# Patient Record
Sex: Male | Born: 1970
Health system: Southern US, Community
[De-identification: ages and names within clinical notes are randomized; demographics above are authoritative.]

## PROBLEM LIST (undated history)

## (undated) DIAGNOSIS — E785 Hyperlipidemia, unspecified: Secondary | ICD-10-CM

## (undated) DIAGNOSIS — I10 Essential (primary) hypertension: Secondary | ICD-10-CM

## (undated) DIAGNOSIS — E119 Type 2 diabetes mellitus without complications: Secondary | ICD-10-CM

## (undated) DIAGNOSIS — M109 Gout, unspecified: Secondary | ICD-10-CM

## (undated) HISTORY — DX: Essential (primary) hypertension: I10

## (undated) HISTORY — DX: Gout, unspecified: M10.9

## (undated) HISTORY — PX: TONSILLECTOMY AND ADENOIDECTOMY: SHX28

## (undated) HISTORY — DX: Type 2 diabetes mellitus without complications: E11.9

## (undated) HISTORY — DX: Hyperlipidemia, unspecified: E78.5

---

## 2008-09-04 HISTORY — PX: KNEE ARTHROCENTESIS: SUR44

## 2011-01-16 ENCOUNTER — Other Ambulatory Visit: Payer: Self-pay | Admitting: Infectious Diseases

## 2011-01-16 ENCOUNTER — Ambulatory Visit
Admission: RE | Admit: 2011-01-16 | Discharge: 2011-01-16 | Disposition: A | Discharge status: Home / Self Care | Payer: No Typology Code available for payment source | Source: Ambulatory Visit | Attending: Infectious Diseases | Admitting: Infectious Diseases

## 2011-01-16 DIAGNOSIS — R7611 Nonspecific reaction to tuberculin skin test without active tuberculosis: Secondary | ICD-10-CM

## 2014-02-13 ENCOUNTER — Encounter (INDEPENDENT_AMBULATORY_CARE_PROVIDER_SITE_OTHER): Payer: Self-pay | Admitting: Surgery

## 2014-02-13 ENCOUNTER — Ambulatory Visit (INDEPENDENT_AMBULATORY_CARE_PROVIDER_SITE_OTHER): Payer: BC Managed Care – PPO | Admitting: General Surgery

## 2014-03-02 ENCOUNTER — Ambulatory Visit (INDEPENDENT_AMBULATORY_CARE_PROVIDER_SITE_OTHER): Payer: BC Managed Care – PPO | Admitting: Surgery

## 2014-03-02 ENCOUNTER — Encounter (INDEPENDENT_AMBULATORY_CARE_PROVIDER_SITE_OTHER): Payer: Self-pay | Admitting: Surgery

## 2014-03-02 VITALS — BP 124/80 | HR 80 | Temp 97.1°F | Ht 74.0 in | Wt 285.0 lb

## 2014-03-02 DIAGNOSIS — L0501 Pilonidal cyst with abscess: Secondary | ICD-10-CM

## 2014-03-02 NOTE — Progress Notes (Signed)
Subjective:     Patient ID: Micheal Delgado, male   DOB: 1971/03/24, 43 y.o.   MRN: [id number]  HPI This is a pleasant gentleman referred by Dr. Ihor DowNnodi for evaluation for chronic pilonidal abscess. His first occurrence was only 2 months ago. He had 2 separate courses of antibiotics and the area has totally resolved now. He is currently pain-free he has had no further drainage.  Review of Systems     Objective:   Physical Exam On examination, at the top of his gluteal cleft there is a chronic ingrown hair area consistent with a chronic pilonidal cyst. There is no erythema in the area is nontender. He is area of the gluteal cleft    Assessment:     Chronic pilonidal cyst with abscess     Plan:     I discussed the diagnosis with him in detail. I discussed surgical removal of this area. I also discussed continued expectant management. He would like to follow this expectantly which I feel is reasonable. He'll come back and see me if he develops another infection. If not I will see him as needed

## 2014-03-03 ENCOUNTER — Ambulatory Visit (INDEPENDENT_AMBULATORY_CARE_PROVIDER_SITE_OTHER): Payer: BC Managed Care – PPO | Admitting: General Surgery

## 2014-12-16 ENCOUNTER — Encounter: Payer: Self-pay | Admitting: Skilled Nursing Facility1

## 2014-12-16 ENCOUNTER — Encounter: Payer: BLUE CROSS/BLUE SHIELD | Attending: Family Medicine | Admitting: Skilled Nursing Facility1

## 2014-12-16 VITALS — Ht 74.0 in | Wt 288.0 lb

## 2014-12-16 DIAGNOSIS — E119 Type 2 diabetes mellitus without complications: Secondary | ICD-10-CM | POA: Diagnosis not present

## 2014-12-16 DIAGNOSIS — Z713 Dietary counseling and surveillance: Secondary | ICD-10-CM | POA: Diagnosis not present

## 2014-12-16 NOTE — Progress Notes (Signed)
Diabetes Self-Management Education  Visit Type:    Appt. Start Time: 4:30 Appt. End Time: 5:45  12/16/2014  Mr. Micheal Delgado, identified by name and date of birth, is a 44 y.o. male with a diagnosis of Diabetes: Type 2.  Other people present during visit:      ASSESSMENT  Height  (1.88 m), weight 288 lb (130.636 kg). Body mass index is 36.96 kg/(m^2).  Pt states he has stopped eating red meat in order to better manage his gout which has worked for him. Pt states he has ankle and knee pain which keeps him from working out regularly. Pt states he has lost about 30 pounds within the last year. Pt states he has gout flares with beans he avoids them.  Initial Visit Information:  Are you currently following a meal plan?: No   Are you taking your medications as prescribed?: Yes Are you checking your feet?: Yes How many days per week are you checking your feet?: 7 How often do you need to have someone help you when you read instructions, pamphlets, or other written materials from your doctor or pharmacy?: 1 - Never    Psychosocial:     Patient Belief/Attitude about Diabetes: Motivated to manage diabetes Self-care barriers: None Self-management support: Family Patient Concerns: Nutrition/Meal planning, Glycemic Control, Weight Control Special Needs: None Preferred Learning Style: Auditory Learning Readiness: Ready  Complications:   Last HgB A1C per patient/outside source: 7.8 mg/dL How often do you check your blood sugar?: 0 times/day (not testing) Number of hypoglycemic episodes per month: 0 Number of hyperglycemic episodes per week: 0 Have you had a dilated eye exam in the past 12 months?: Yes Have you had a dental exam in the past 12 months?: Yes  Diet Intake:  Breakfast: peanut butter and jelly sandwhich----dry cereal----oatmeal Snack (morning): apple Lunch: cucumber with hummus, yogurt, fruit Snack (afternoon): peanuts Dinner: vegetable, starch, protein Snack  (evening): none Beverage(s): carbonated water, skim milk, tea/coffee, wine on saturday nights  Exercise:  Exercise:  (4 days a week, it varies, 30-45 minutes)  Individualized Plan for Diabetes Self-Management Training:   Learning Objective:  Patient will have a greater understanding of diabetes self-management.  Patient education plan per assessed needs and concerns is to attend individual sessions for     Education Topics Reviewed with Patient Today:  Definition of diabetes, type 1 and 2, and the diagnosis of diabetes Role of diet in the treatment of diabetes and the relationship between the three main macronutrients and blood glucose level, Food label reading, portion sizes and measuring food., Carbohydrate counting, Reviewed blood glucose goals for pre and post meals and how to evaluate the patients' food intake on their blood glucose level., Information on hints to eating out and maintain blood glucose control. Role of exercise on diabetes management, blood pressure control and cardiac health.                PATIENTS GOALS/Plan (Developed by the patient):  Nutrition: Follow meal plan discussed, General guidelines for healthy choices and portions discussed, Adjust meds/carbs with exercise as discussed Physical Activity: Exercise 5-7 days per week Monitoring : test my blood glucose as discussed (note x per day with comment) (A couple times a week)  Plan:   There are no Patient Instructions on file for this visit.  Expected Outcomes:  Demonstrated interest in learning. Expect positive outcomes  Education material provided: Living Well with Diabetes, Meal plan card and Snack sheet  If problems or questions, patient  to contact team via:  Phone  Future DSME appointment: PRN

## 2016-11-05 DIAGNOSIS — R05 Cough: Secondary | ICD-10-CM | POA: Diagnosis not present

## 2016-11-20 DIAGNOSIS — I10 Essential (primary) hypertension: Secondary | ICD-10-CM | POA: Diagnosis not present

## 2016-11-20 DIAGNOSIS — E119 Type 2 diabetes mellitus without complications: Secondary | ICD-10-CM | POA: Diagnosis not present

## 2016-11-20 DIAGNOSIS — E7800 Pure hypercholesterolemia, unspecified: Secondary | ICD-10-CM | POA: Diagnosis not present

## 2016-11-22 DIAGNOSIS — E7800 Pure hypercholesterolemia, unspecified: Secondary | ICD-10-CM | POA: Diagnosis not present

## 2016-11-22 DIAGNOSIS — E119 Type 2 diabetes mellitus without complications: Secondary | ICD-10-CM | POA: Diagnosis not present

## 2016-11-22 DIAGNOSIS — I10 Essential (primary) hypertension: Secondary | ICD-10-CM | POA: Diagnosis not present

## 2016-11-22 DIAGNOSIS — R809 Proteinuria, unspecified: Secondary | ICD-10-CM | POA: Diagnosis not present

## 2017-02-15 DIAGNOSIS — M65872 Other synovitis and tenosynovitis, left ankle and foot: Secondary | ICD-10-CM | POA: Diagnosis not present

## 2017-02-15 DIAGNOSIS — M25571 Pain in right ankle and joints of right foot: Secondary | ICD-10-CM | POA: Diagnosis not present

## 2017-02-15 DIAGNOSIS — M25572 Pain in left ankle and joints of left foot: Secondary | ICD-10-CM | POA: Diagnosis not present

## 2017-02-15 DIAGNOSIS — M722 Plantar fascial fibromatosis: Secondary | ICD-10-CM | POA: Diagnosis not present

## 2017-02-15 DIAGNOSIS — M79671 Pain in right foot: Secondary | ICD-10-CM | POA: Diagnosis not present

## 2017-02-15 DIAGNOSIS — M79672 Pain in left foot: Secondary | ICD-10-CM | POA: Diagnosis not present

## 2017-03-04 DIAGNOSIS — T63483A Toxic effect of venom of other arthropod, assault, initial encounter: Secondary | ICD-10-CM | POA: Diagnosis not present

## 2017-03-08 DIAGNOSIS — M722 Plantar fascial fibromatosis: Secondary | ICD-10-CM | POA: Diagnosis not present

## 2017-03-14 DIAGNOSIS — E119 Type 2 diabetes mellitus without complications: Secondary | ICD-10-CM | POA: Diagnosis not present

## 2017-03-14 DIAGNOSIS — I10 Essential (primary) hypertension: Secondary | ICD-10-CM | POA: Diagnosis not present

## 2017-05-23 DIAGNOSIS — M71571 Other bursitis, not elsewhere classified, right ankle and foot: Secondary | ICD-10-CM | POA: Diagnosis not present

## 2017-05-23 DIAGNOSIS — M65872 Other synovitis and tenosynovitis, left ankle and foot: Secondary | ICD-10-CM | POA: Diagnosis not present

## 2017-05-23 DIAGNOSIS — M722 Plantar fascial fibromatosis: Secondary | ICD-10-CM | POA: Diagnosis not present

## 2017-07-16 DIAGNOSIS — M722 Plantar fascial fibromatosis: Secondary | ICD-10-CM | POA: Diagnosis not present

## 2017-07-16 DIAGNOSIS — M71572 Other bursitis, not elsewhere classified, left ankle and foot: Secondary | ICD-10-CM | POA: Diagnosis not present

## 2017-07-16 DIAGNOSIS — Z7984 Long term (current) use of oral hypoglycemic drugs: Secondary | ICD-10-CM | POA: Diagnosis not present

## 2017-07-16 DIAGNOSIS — H35033 Hypertensive retinopathy, bilateral: Secondary | ICD-10-CM | POA: Diagnosis not present

## 2017-07-16 DIAGNOSIS — E119 Type 2 diabetes mellitus without complications: Secondary | ICD-10-CM | POA: Diagnosis not present

## 2017-07-16 DIAGNOSIS — M71571 Other bursitis, not elsewhere classified, right ankle and foot: Secondary | ICD-10-CM | POA: Diagnosis not present

## 2017-10-02 DIAGNOSIS — J029 Acute pharyngitis, unspecified: Secondary | ICD-10-CM | POA: Diagnosis not present

## 2017-10-02 DIAGNOSIS — J111 Influenza due to unidentified influenza virus with other respiratory manifestations: Secondary | ICD-10-CM | POA: Diagnosis not present

## 2017-11-12 DIAGNOSIS — E119 Type 2 diabetes mellitus without complications: Secondary | ICD-10-CM | POA: Diagnosis not present

## 2017-11-12 DIAGNOSIS — E7800 Pure hypercholesterolemia, unspecified: Secondary | ICD-10-CM | POA: Diagnosis not present

## 2017-11-15 DIAGNOSIS — I10 Essential (primary) hypertension: Secondary | ICD-10-CM | POA: Diagnosis not present

## 2017-11-15 DIAGNOSIS — Z0000 Encounter for general adult medical examination without abnormal findings: Secondary | ICD-10-CM | POA: Diagnosis not present

## 2017-11-15 DIAGNOSIS — E119 Type 2 diabetes mellitus without complications: Secondary | ICD-10-CM | POA: Diagnosis not present

## 2017-11-15 DIAGNOSIS — E7800 Pure hypercholesterolemia, unspecified: Secondary | ICD-10-CM | POA: Diagnosis not present

## 2018-04-15 DIAGNOSIS — Z6835 Body mass index (BMI) 35.0-35.9, adult: Secondary | ICD-10-CM | POA: Diagnosis not present

## 2018-04-15 DIAGNOSIS — L247 Irritant contact dermatitis due to plants, except food: Secondary | ICD-10-CM | POA: Diagnosis not present

## 2018-05-18 DIAGNOSIS — J029 Acute pharyngitis, unspecified: Secondary | ICD-10-CM | POA: Diagnosis not present

## 2018-06-12 DIAGNOSIS — E119 Type 2 diabetes mellitus without complications: Secondary | ICD-10-CM | POA: Diagnosis not present

## 2018-06-12 DIAGNOSIS — I10 Essential (primary) hypertension: Secondary | ICD-10-CM | POA: Diagnosis not present

## 2018-06-12 DIAGNOSIS — Z23 Encounter for immunization: Secondary | ICD-10-CM | POA: Diagnosis not present

## 2018-06-12 DIAGNOSIS — E7800 Pure hypercholesterolemia, unspecified: Secondary | ICD-10-CM | POA: Diagnosis not present

## 2018-10-18 ENCOUNTER — Emergency Department (HOSPITAL_COMMUNITY): Payer: BLUE CROSS/BLUE SHIELD

## 2018-10-18 ENCOUNTER — Emergency Department (HOSPITAL_COMMUNITY)
Admission: EM | Admit: 2018-10-18 | Discharge: 2018-10-19 | Disposition: A | Discharge status: Home / Self Care | Payer: BLUE CROSS/BLUE SHIELD | Attending: Emergency Medicine | Admitting: Emergency Medicine

## 2018-10-18 ENCOUNTER — Other Ambulatory Visit: Payer: Self-pay

## 2018-10-18 ENCOUNTER — Encounter (HOSPITAL_COMMUNITY): Payer: Self-pay

## 2018-10-18 DIAGNOSIS — Z7984 Long term (current) use of oral hypoglycemic drugs: Secondary | ICD-10-CM | POA: Diagnosis not present

## 2018-10-18 DIAGNOSIS — E119 Type 2 diabetes mellitus without complications: Secondary | ICD-10-CM | POA: Insufficient documentation

## 2018-10-18 DIAGNOSIS — Z79899 Other long term (current) drug therapy: Secondary | ICD-10-CM | POA: Diagnosis not present

## 2018-10-18 DIAGNOSIS — R079 Chest pain, unspecified: Secondary | ICD-10-CM | POA: Diagnosis not present

## 2018-10-18 DIAGNOSIS — R0602 Shortness of breath: Secondary | ICD-10-CM | POA: Diagnosis not present

## 2018-10-18 DIAGNOSIS — E785 Hyperlipidemia, unspecified: Secondary | ICD-10-CM | POA: Insufficient documentation

## 2018-10-18 LAB — CBC
HCT: 46.5 % (ref 39.0–52.0)
Hemoglobin: 15.3 g/dL (ref 13.0–17.0)
MCH: 29.1 pg (ref 26.0–34.0)
MCHC: 32.9 g/dL (ref 30.0–36.0)
MCV: 88.4 fL (ref 80.0–100.0)
PLATELETS: 306 10*3/uL (ref 150–400)
RBC: 5.26 MIL/uL (ref 4.22–5.81)
RDW: 12.9 % (ref 11.5–15.5)
WBC: 10.4 10*3/uL (ref 4.0–10.5)
nRBC: 0 % (ref 0.0–0.2)

## 2018-10-18 LAB — BASIC METABOLIC PANEL
Anion gap: 11 (ref 5–15)
BUN: 18 mg/dL (ref 6–20)
CALCIUM: 9 mg/dL (ref 8.9–10.3)
CO2: 24 mmol/L (ref 22–32)
Chloride: 106 mmol/L (ref 98–111)
Creatinine, Ser: 1.13 mg/dL (ref 0.61–1.24)
GFR calc non Af Amer: >60 mL/min (ref >60)
Glucose, Bld: 180 mg/dL — ABNORMAL HIGH (ref 70–99)
Potassium: 3.9 mmol/L (ref 3.5–5.1)
Sodium: 141 mmol/L (ref 135–145)

## 2018-10-18 LAB — I-STAT TROPONIN, ED: Troponin i, poc: 0 ng/mL (ref 0.00–0.08)

## 2018-10-18 MED ORDER — SODIUM CHLORIDE 0.9% FLUSH: 3.0000 mL | Freq: Once | INTRAVENOUS | Status: DC

## 2018-10-18 MED ORDER — AMLODIPINE BESYLATE 5 MG PO TABS
5.0000 mg | ORAL_TABLET | Freq: Once | ORAL | Status: AC
  Administered 2018-10-19: 5 mg via ORAL
  Filled 2018-10-18: qty 1

## 2018-10-18 NOTE — ED Triage Notes (Signed)
Pt states that he has been having CP, tightness and SOB since last night with diaphoresis. Some nausea, denies radiation

## 2018-10-19 LAB — I-STAT TROPONIN, ED: Troponin i, poc: 0 ng/mL (ref 0.00–0.08)

## 2018-10-19 MED ORDER — PANTOPRAZOLE SODIUM 40 MG PO TBEC: 40.0000 mg | DELAYED_RELEASE_TABLET | Freq: Every day | ORAL | 0 refills | Status: AC

## 2018-10-19 NOTE — ED Notes (Signed)
Patient verbalizes understanding of discharge instructions. Opportunity for questioning and answers were provided. Armband removed by staff, pt discharged from ED.  

## 2018-10-19 NOTE — Discharge Instructions (Signed)
Take a low-dose aspirin daily.  Schedule follow-up with cardiology for recheck.  You may also want to schedule follow-up with your primary doctor for recheck of your blood pressure.  Return to the ER for any persistent chest pain.

## 2018-10-19 NOTE — ED Provider Notes (Signed)
[facility] EMERGENCY DEPARTMENT Provider Note   CSN: [id number] Arrival date & time: 10/18/18  1955     History   Chief Complaint Chief Complaint  Patient presents with  . Chest Pain    HPI Micheal Delgado is a 48 y.o. male.  Patient presents to the emergency department for evaluation of chest pain.  Patient has had chest pain each of the last 2 nights.  He reports that he had pain while lying down on the couch last night, woke up and the pain was still there, went to bed and then woke up feeling well this morning.  He did well through the day, then went out for sushi tonight.  After he ate tonight he started to have left-sided pain again.  He describes it as a tightness.  He reports that there was some shortness of breath and diaphoresis associated with it, however, thinks he might of had a small panic attack.  Symptoms are now resolved.  No previous cardiac history but does have a history of hypertension, borderline hypercholesterolemia, diabetes and obesity.  No family history of early heart disease.     Past Medical History:  Diagnosis Date  . Diabetes mellitus without complication (HCC)   . Gout   . Hyperlipidemia     Patient Active Problem List   Diagnosis Date Noted  . Pilonidal cyst with abscess 03/02/2014    Past Surgical History:  Procedure Laterality Date  . KNEE ARTHROCENTESIS  2010   right  . TONSILLECTOMY AND ADENOIDECTOMY          Home Medications    Prior to Admission medications   Medication Sig Start Date End Date Taking? Authorizing Provider  amLODipine (NORVASC) 2.5 MG tablet Take 2.5 mg by mouth daily. 09/29/18  Yes [provider]  fluticasone (FLONASE) 50 MCG/ACT nasal spray Place 1-2 sprays into both nostrils daily as needed (for seasonal allergies).    Yes [provider]  ibuprofen (ADVIL,MOTRIN) 200 MG tablet Take 400 mg by mouth every 8 (eight) hours as needed (for pain).   Yes [provider]    lisinopril (PRINIVIL,ZESTRIL) 40 MG tablet Take 40 mg by mouth daily.   Yes [provider]  metFORMIN (GLUCOPHAGE) 500 MG tablet Take 1,000 mg by mouth 2 (two) times daily with a meal.    Yes [provider]  pantoprazole (PROTONIX) 40 MG tablet Take 1 tablet (40 mg total) by mouth daily. 10/19/18   Gilda Crease, MD    Family History Family History  Problem Relation Age of Onset  . Heart disease Other     Social History Social History   Tobacco Use  . Smoking status: Never Smoker  Substance Use Topics  . Alcohol use: No  . Drug use: No     Allergies   Patient has no known allergies.   Review of Systems Review of Systems  Constitutional: Positive for diaphoresis.  Respiratory: Positive for chest tightness and shortness of breath.   Cardiovascular: Positive for chest pain.  All other systems reviewed and are negative.    Physical Exam Updated Vital Signs BP (!) 155/87   Pulse (!) 57   Temp 97.7 F (36.5 C) (Oral)   Resp (!) 22   Ht 6' 1.5" (1.867 m)   Wt 121.1 kg   SpO2 94%   BMI 34.75 kg/m   Physical Exam Vitals signs and nursing note reviewed.  Constitutional:      General: He is not in  acute distress.    Appearance: Normal appearance. He is well-developed.  HENT:     Head: Normocephalic and atraumatic.     Right Ear: Hearing normal.     Left Ear: Hearing normal.     Nose: Nose normal.  Eyes:     Conjunctiva/sclera: Conjunctivae normal.     Pupils: Pupils are equal, round, and reactive to light.  Neck:     Musculoskeletal: Normal range of motion and neck supple.  Cardiovascular:     Rate and Rhythm: Regular rhythm.     Heart sounds: S1 normal and S2 normal. No murmur. No friction rub. No gallop.   Pulmonary:     Effort: Pulmonary effort is normal. No respiratory distress.     Breath sounds: Normal breath sounds.  Chest:     Chest wall: No tenderness.  Abdominal:     General: Bowel sounds are normal.     Palpations:  Abdomen is soft.     Tenderness: There is no abdominal tenderness. There is no guarding or rebound. Negative signs include Murphy's sign and McBurney's sign.     Hernia: No hernia is present.  Musculoskeletal: Normal range of motion.  Skin:    General: Skin is warm and dry.     Findings: No rash.  Neurological:     Mental Status: He is alert and oriented to person, place, and time.     GCS: GCS eye subscore is 4. GCS verbal subscore is 5. GCS motor subscore is 6.     Cranial Nerves: No cranial nerve deficit.     Sensory: No sensory deficit.     Coordination: Coordination normal.  Psychiatric:        Speech: Speech normal.        Behavior: Behavior normal.        Thought Content: Thought content normal.      ED Treatments / Results  Labs (all labs ordered are listed, but only abnormal results are displayed) Labs Reviewed  BASIC METABOLIC PANEL - Abnormal; Notable for the following components:      Result Value   Glucose, Bld 180 (*)    All other components within normal limits  CBC  I-STAT TROPONIN, ED  I-STAT TROPONIN, ED    EKG EKG Interpretation  Date/Time:  Saturday October 19 2018 00:12:32 EST Ventricular Rate:  59 PR Interval:    QRS Duration: 105 QT Interval:  425 QTC Calculation: 421 R Axis:   75 Text Interpretation:  Sinus rhythm Abnormal R-wave progression, early transition Probable anterolateral infarct, old Nonspecific ST abnormality Baseline wander in lead(s) V1 No previous tracing Confirmed by Gilda CreasePollina, Guadalupe Kerekes J [phone]([id number]) on 10/19/2018 12:16:47 AM   Radiology Dg Chest 2 View  Result Date: 10/18/2018 CLINICAL DATA:  Chest pain, tightness and dyspnea since last evening. EXAM: CHEST - 2 VIEW COMPARISON:  01/16/2011 FINDINGS: The heart size and mediastinal contours are within normal limits. Both lungs are clear. The visualized skeletal structures are unremarkable. IMPRESSION: No active cardiopulmonary disease. Electronically Signed   By: Tollie Ethavid  Kwon M.D.    On: 10/18/2018 21:29    Procedures Procedures (including critical care time)  Medications Ordered in ED Medications  sodium chloride flush (NS) 0.9 % injection 3 mL (has no administration in time range)  amLODipine (NORVASC) tablet 5 mg (5 mg Oral Given 10/19/18 0001)     Initial Impression / Assessment and Plan / ED Course  I have reviewed the triage vital signs and the nursing notes.  Pertinent labs &  imaging results that were available during my care of the patient were reviewed by me and considered in my medical decision making (see chart for details).     Patient presents to the emergency department for evaluation of chest pain.  He has had episodes of chest pain twice in the last 24 hours.  At time of evaluation in the ER his symptoms have resolved.  Patient developed left-sided chest pain after eating tonight.  He thinks it might of been indigestion, which he has had in the past.  He does, however, have multiple cardiac risk factors.  His EKG with nonspecific changes, no ST depressions, no evidence of LVH or conduction delay.  Patient's heart score is a 3.  As he is pain-free currently and has been since arrival in the ER, he is felt to be low risk.  Troponin negative x2.  Blood pressure was elevated on arrival, improved with additional Norvasc.  Patient will initiate low-dose aspirin, follow-up with cardiology for possible outpatient stress test.  Return for recurrent chest pain that is persistent.  Follow-up with PCP for blood pressure check.  Final Clinical Impressions(s) / ED Diagnoses   Final diagnoses:  Chest pain, unspecified type    ED Discharge Orders         Ordered    pantoprazole (PROTONIX) 40 MG tablet  Daily     10/19/18 0231           Gilda Crease, MD 10/19/18 [phone]

## 2018-10-21 ENCOUNTER — Encounter: Payer: Self-pay | Admitting: Cardiology

## 2018-10-21 DIAGNOSIS — I10 Essential (primary) hypertension: Secondary | ICD-10-CM | POA: Insufficient documentation

## 2018-10-21 DIAGNOSIS — E785 Hyperlipidemia, unspecified: Secondary | ICD-10-CM | POA: Insufficient documentation

## 2018-10-21 DIAGNOSIS — R079 Chest pain, unspecified: Secondary | ICD-10-CM | POA: Insufficient documentation

## 2018-10-21 DIAGNOSIS — E119 Type 2 diabetes mellitus without complications: Secondary | ICD-10-CM | POA: Insufficient documentation

## 2018-10-21 NOTE — Progress Notes (Signed)
Cardiology Office Note    Date:  10/22/2018   ID:  Micheal Delgado, DOB 1971-03-21, MRN [id number]  PCP:  Sigmund Hazel, MD  Cardiologist:  Armanda Magic, MD   Chief Complaint  Patient presents with  . New Patient (Initial Visit)    Chest pain    History of Present Illness:  Micheal Delgado is a 48 y.o. male who is being seen today for the evaluation of chest pain at the request of Sigmund Hazel, MD.  This is a 48yo male with a history of DM, HTN and hyperlipidemia who was seen in the ER on 2/14 with complaints of CP.  He states since last Thursday he has been having off-and-on chest pain that mainly occurs at night when he is in bed.  He will sometimes notice it when he gets up in the morning but as the day progresses it resolves on its own.  It is not associated with exertion in any way.  He thought it was reflux because he is had a lot of belching and the belching does help relieve the pressure.  He is also had to sit up in a recliner for the past few nights because the discomfort in his chest is so uncomfortable.  He is currently on Protonix 40 mg daily for GERD.  He was initially laying on the couch when the pain started.  He states that the first night he had it he fell asleep on the couch and it was still there when he woke up and then went to bed and in the am the pain was gone.  The next night he ate Sushi and the pain reoccurred.  He described it as a tightness and left sided associated with SOB, belching and diaphoresis but thinks that it may be related to GERD.  In ER his workup was normal with troponin 0 x 2 and EKG showed early repolarization.  Cxray was normal. He has no hx of tobacco use and no immediate family hx of premature CAD both his grandfathers died in their 37s of MIs and he has a half brother who died at 33 of reported MI but also had an arrhythmia.Marland Kitchen He is now referred to Cardiology for further evaluation.     Past Medical History:  Diagnosis Date  . Diabetes mellitus  without complication (HCC)   . Gout   . Hyperlipidemia   . Hypertension     Past Surgical History:  Procedure Laterality Date  . KNEE ARTHROCENTESIS  2010   right  . TONSILLECTOMY AND ADENOIDECTOMY      Current Medications: Current Meds  Medication Sig  . amLODipine (NORVASC) 2.5 MG tablet Take 2.5 mg by mouth daily.  . fluticasone (FLONASE) 50 MCG/ACT nasal spray Place 1-2 sprays into both nostrils daily as needed (for seasonal allergies).   Marland Kitchen ibuprofen (ADVIL,MOTRIN) 200 MG tablet Take 400 mg by mouth every 8 (eight) hours as needed (for pain).  Marland Kitchen lisinopril (PRINIVIL,ZESTRIL) 40 MG tablet Take 40 mg by mouth daily.  . metFORMIN (GLUCOPHAGE) 500 MG tablet Take 1,000 mg by mouth 2 (two) times daily with a meal.   . pantoprazole (PROTONIX) 40 MG tablet Take 1 tablet (40 mg total) by mouth daily.    Allergies:   Patient has no known allergies.   Social History   Socioeconomic History  . Marital status: Married    Spouse name: Not on file  . Number of children: Not on file  . Years of education: Not on file  .  Highest education level: Not on file  Occupational History  . Not on file  Social Needs  . Financial resource strain: Not on file  . Food insecurity:    Worry: Not on file    Inability: Not on file  . Transportation needs:    Medical: Not on file    Non-medical: Not on file  Tobacco Use  . Smoking status: Never Smoker  . Smokeless tobacco: Never Used  Substance and Sexual Activity  . Alcohol use: No  . Drug use: No  . Sexual activity: Not on file  Lifestyle  . Physical activity:    Days per week: Not on file    Minutes per session: Not on file  . Stress: Not on file  Relationships  . Social connections:    Talks on phone: Not on file    Gets together: Not on file    Attends religious service: Not on file    Active member of club or organization: Not on file    Attends meetings of clubs or organizations: Not on file    Relationship status: Not on file   Other Topics Concern  . Not on file  Social History Narrative  . Not on file     Family History:  The patient's family history includes Heart disease in an other family member.   ROS:   Please see the history of present illness.    ROS All other systems reviewed and are negative.  No flowsheet data found.     PHYSICAL EXAM:   VS:  BP (!) 150/98   Pulse 72   Ht 6' 1.5" (1.867 m)   Wt 273 lb 12.8 oz (124.2 kg)   SpO2 93%   BMI 35.63 kg/m    GEN: Well nourished, well developed, in no acute distress  HEENT: normal  Neck: no JVD, carotid bruits, or masses Cardiac: RRR; no murmurs, rubs, or gallops,no edema.  Intact distal pulses bilaterally.  Respiratory:  clear to auscultation bilaterally, normal work of breathing GI: soft, nontender, nondistended, + BS MS: no deformity or atrophy  Skin: warm and dry, no rash Neuro:  Alert and Oriented x 3, Strength and sensation are intact Psych: euthymic mood, full affect  Wt Readings from Last 3 Encounters:  10/22/18 273 lb 12.8 oz (124.2 kg)  10/18/18 267 lb (121.1 kg)  12/16/14 288 lb (130.6 kg)      Studies/Labs Reviewed:   EKG:  EKG is not ordered today.  Recent Labs: 10/18/2018: BUN 18; Creatinine, Ser 1.13; Hemoglobin 15.3; Platelets 306; Potassium 3.9; Sodium 141   Lipid Panel No results found for: CHOL, TRIG, HDL, CHOLHDL, VLDL, LDLCALC, LDLDIRECT  Additional studies/ records that were reviewed today include:  Hospital notes and labs    ASSESSMENT:    1. Chest pain, unspecified type   2. Benign essential HTN   3. Hyperlipidemia LDL goal <70   4. Type 2 diabetes mellitus with hyperosmolarity without coma, without long-term current use of insulin (HCC)   5. Essential hypertension      PLAN:  In order of problems listed above:  1.  Chest pain - symptoms atypical in that they started at rest while laying on the couch and continued during the night.  Then reoccurred the next night after eating dinner.  His  symptoms sound more related to GERD and that they are much worse when he lays in bed and actually has had to sleep sitting in a chair and they improved with exertion.  They are also associated with a lot of belching.  EKG with early repolarization and Trop neg x 2.  He does have CRFs including HTN, hyperlipidemia and DM. His EKG is not normal and shows early repol.  I have recommend chest CT for coronary calcium scoring as well as a stress Myoview to rule out ischemia.    2.  HTN - BP is poorly controlled on exam.  He tells me that his blood pressure was well controlled on his physical exam back in the fall but when he was in the ER the BP was elevated and he is noticed over the past couple days the blood pressure is more elevated than usual.  He will continue on Amlodipine 2.5mg  daily and Lisinopril 40mg  daily.  I encouraged him to make an appointment with his PCP to discuss his blood pressure treatment.  3.  Hyperlipidemia - This is followed by PCP and currently diet controlled.   4. Type 2 Dm - followed by PCP.  Currently on Metformin.    Medication Adjustments/Labs and Tests Ordered: Current medicines are reviewed at length with the patient today.  Concerns regarding medicines are outlined above.  Medication changes, Labs and Tests ordered today are listed in the Patient Instructions below.  Patient Instructions  Medication Instructions:  Your provider recommends that you continue on your current medications as directed. Please refer to the Current Medication list given to you today.    Labwork: None  Testing/Procedures: Dr. Mayford Knife recommends you have a CORONARY CALCIUM SCORE.  Your physician has requested that you have en exercise stress myoview. For further information please visit [email or web address] Please follow instruction sheet, as given.  Follow-Up: Your provider recommends that you schedule a follow-up appointment AS NEEDED pending study results.    Signed, Armanda Magic,  MD  10/22/2018 3:37 PM    [facility] [address] [place], [place], [place]  [address] Phone: [phone]; Fax: [phone]

## 2018-10-22 ENCOUNTER — Ambulatory Visit: Payer: BLUE CROSS/BLUE SHIELD | Admitting: Cardiology

## 2018-10-22 ENCOUNTER — Encounter: Payer: Self-pay | Admitting: Cardiology

## 2018-10-22 DIAGNOSIS — E785 Hyperlipidemia, unspecified: Secondary | ICD-10-CM

## 2018-10-22 DIAGNOSIS — E1100 Type 2 diabetes mellitus with hyperosmolarity without nonketotic hyperglycemic-hyperosmolar coma (NKHHC): Secondary | ICD-10-CM

## 2018-10-22 DIAGNOSIS — R079 Chest pain, unspecified: Secondary | ICD-10-CM

## 2018-10-22 DIAGNOSIS — I10 Essential (primary) hypertension: Secondary | ICD-10-CM | POA: Insufficient documentation

## 2018-10-22 NOTE — Patient Instructions (Signed)
Medication Instructions:  Your provider recommends that you continue on your current medications as directed. Please refer to the Current Medication list given to you today.    Labwork: None  Testing/Procedures: Dr. Mayford Knife recommends you have a CORONARY CALCIUM SCORE.  Your physician has requested that you have en exercise stress myoview. For further information please visit [email or web address] Please follow instruction sheet, as given.  Follow-Up: Your provider recommends that you schedule a follow-up appointment AS NEEDED pending study results.

## 2018-10-28 DIAGNOSIS — E119 Type 2 diabetes mellitus without complications: Secondary | ICD-10-CM | POA: Diagnosis not present

## 2018-10-28 DIAGNOSIS — E7800 Pure hypercholesterolemia, unspecified: Secondary | ICD-10-CM | POA: Diagnosis not present

## 2018-10-28 DIAGNOSIS — I10 Essential (primary) hypertension: Secondary | ICD-10-CM | POA: Diagnosis not present

## 2018-10-28 DIAGNOSIS — R1031 Right lower quadrant pain: Secondary | ICD-10-CM | POA: Diagnosis not present

## 2018-10-30 DIAGNOSIS — E119 Type 2 diabetes mellitus without complications: Secondary | ICD-10-CM | POA: Diagnosis not present

## 2018-10-30 DIAGNOSIS — Z7984 Long term (current) use of oral hypoglycemic drugs: Secondary | ICD-10-CM | POA: Diagnosis not present

## 2018-11-04 ENCOUNTER — Ambulatory Visit (HOSPITAL_COMMUNITY): Payer: BLUE CROSS/BLUE SHIELD

## 2018-11-04 ENCOUNTER — Other Ambulatory Visit: Payer: BLUE CROSS/BLUE SHIELD

## 2018-11-12 ENCOUNTER — Other Ambulatory Visit: Payer: Self-pay | Admitting: Family Medicine

## 2018-11-12 DIAGNOSIS — R1031 Right lower quadrant pain: Secondary | ICD-10-CM

## 2018-11-26 ENCOUNTER — Telehealth (HOSPITAL_COMMUNITY): Payer: Self-pay

## 2018-11-26 ENCOUNTER — Telehealth: Payer: Self-pay

## 2018-11-26 NOTE — Telephone Encounter (Signed)
-----   Message from Quintella Reichert, MD sent at 11/24/2018  6:35 PM EDT ----- OK to wait 4-6 weeks for nuclear stress test.   Romeo Apple please call patient and see how he is feeling  Traci ----- Message ----- From: Quintella Reichert, MD Sent: 11/22/2018   3:41 PM EDT To: Quintella Reichert, MD   Dr. Mayford Knife   Due to mandatory government regulations rearding COVID-19, we are postponing all elective and non-urgent cardiac imaging procedures.   Micheal Delgado is scheduled for a stress test on 12/11/2018. We ask that you follow up with your patient for re-evaluation of symptoms until we are able to reschedule patients for cardiac imaging tests (April 20th, 2020). If you feel patient is too high risk to wait, then proceed with cardiac cath or, alternatively, medical management with close followup with phone call to the patient at your discretion and if symptoms worsen, proceed with cath.   Please forward your decision to both Robin Moffit and Pearline Cables to cancel study and be placed on call back to reschedule study.

## 2018-11-26 NOTE — Telephone Encounter (Signed)
OK to wait on coronary calcium score for 6 weeks   Traci

## 2018-11-26 NOTE — Telephone Encounter (Signed)
Patient was called in regards to his CT appointment cancellation. Patient acknowledged and appreciated the call.

## 2018-11-26 NOTE — Telephone Encounter (Signed)
Left a message to call back.

## 2018-12-11 ENCOUNTER — Inpatient Hospital Stay: Admission: RE | Admit: 2018-12-11 | Payer: BLUE CROSS/BLUE SHIELD | Source: Ambulatory Visit

## 2018-12-11 ENCOUNTER — Encounter (HOSPITAL_COMMUNITY): Payer: BLUE CROSS/BLUE SHIELD

## 2018-12-12 ENCOUNTER — Ambulatory Visit
Admission: RE | Admit: 2018-12-12 | Discharge: 2018-12-12 | Disposition: A | Discharge status: Home / Self Care | Payer: BLUE CROSS/BLUE SHIELD | Source: Ambulatory Visit | Attending: Family Medicine | Admitting: Family Medicine

## 2018-12-12 ENCOUNTER — Other Ambulatory Visit: Payer: Self-pay

## 2018-12-12 DIAGNOSIS — R1031 Right lower quadrant pain: Secondary | ICD-10-CM

## 2018-12-12 MED ORDER — IOPAMIDOL (ISOVUE-300) INJECTION 61%
100.0000 mL | Freq: Once | INTRAVENOUS | Status: AC | PRN
  Administered 2018-12-12: 100 mL via INTRAVENOUS

## 2018-12-17 NOTE — Telephone Encounter (Signed)
Left message to call back to reschedule his testing around June.

## 2018-12-18 ENCOUNTER — Encounter: Payer: Self-pay | Admitting: Cardiology

## 2018-12-18 NOTE — Telephone Encounter (Signed)
Duplicate

## 2018-12-18 NOTE — Telephone Encounter (Signed)
New message    Patient is returning your call from 12/17/2018. Please call.

## 2018-12-18 NOTE — Telephone Encounter (Signed)
Spoke with the patient, he stated that he could wait until June or July, he wanted to have both of his testing on the same day.

## 2019-01-30 DIAGNOSIS — M2011 Hallux valgus (acquired), right foot: Secondary | ICD-10-CM | POA: Diagnosis not present

## 2019-01-30 DIAGNOSIS — M25571 Pain in right ankle and joints of right foot: Secondary | ICD-10-CM | POA: Diagnosis not present

## 2019-01-30 DIAGNOSIS — M2012 Hallux valgus (acquired), left foot: Secondary | ICD-10-CM | POA: Diagnosis not present

## 2019-01-30 DIAGNOSIS — M25572 Pain in left ankle and joints of left foot: Secondary | ICD-10-CM | POA: Diagnosis not present

## 2019-02-28 ENCOUNTER — Telehealth (HOSPITAL_COMMUNITY): Payer: Self-pay | Admitting: *Deleted

## 2019-02-28 NOTE — Telephone Encounter (Signed)
Patient given detailed instructions per Myocardial Perfusion Study Information Sheet for the test on 03/04/19. Patient notified to arrive 15 minutes early and that it is imperative to arrive on time for appointment to keep from having the test rescheduled.  If you need to cancel or reschedule your appointment, please call the office within 24 hours of your appointment. . Patient verbalized understanding. Lydie Stammen Jacqueline    

## 2019-03-03 ENCOUNTER — Other Ambulatory Visit (HOSPITAL_COMMUNITY): Payer: Self-pay | Admitting: Cardiology

## 2019-03-03 DIAGNOSIS — R079 Chest pain, unspecified: Secondary | ICD-10-CM

## 2019-03-04 ENCOUNTER — Encounter (HOSPITAL_COMMUNITY): Payer: Self-pay

## 2019-03-04 ENCOUNTER — Ambulatory Visit (INDEPENDENT_AMBULATORY_CARE_PROVIDER_SITE_OTHER)
Admission: RE | Admit: 2019-03-04 | Discharge: 2019-03-04 | Disposition: A | Discharge status: Home / Self Care | Payer: BC Managed Care – PPO | Source: Ambulatory Visit | Attending: Cardiology | Admitting: Cardiology

## 2019-03-04 ENCOUNTER — Other Ambulatory Visit: Payer: Self-pay

## 2019-03-04 ENCOUNTER — Ambulatory Visit (HOSPITAL_COMMUNITY): Payer: BC Managed Care – PPO | Attending: Cardiovascular Disease

## 2019-03-04 DIAGNOSIS — E785 Hyperlipidemia, unspecified: Secondary | ICD-10-CM

## 2019-03-04 DIAGNOSIS — R079 Chest pain, unspecified: Secondary | ICD-10-CM

## 2019-03-04 DIAGNOSIS — I10 Essential (primary) hypertension: Secondary | ICD-10-CM

## 2019-03-04 DIAGNOSIS — E1100 Type 2 diabetes mellitus with hyperosmolarity without nonketotic hyperglycemic-hyperosmolar coma (NKHHC): Secondary | ICD-10-CM

## 2019-03-04 LAB — MYOCARDIAL PERFUSION IMAGING
LV dias vol: 137 mL (ref 62–150)
LV sys vol: 55 mL
Peak HR: 81 {beats}/min
Rest HR: 54 {beats}/min
SDS: 0
SRS: 0
SSS: 0
TID: 0.96

## 2019-03-04 MED ORDER — TECHNETIUM TC 99M TETROFOSMIN IV KIT
32.4000 | PACK | Freq: Once | INTRAVENOUS | Status: AC | PRN
  Administered 2019-03-04: 32.4 via INTRAVENOUS
  Filled 2019-03-04: qty 33

## 2019-03-04 MED ORDER — TECHNETIUM TC 99M TETROFOSMIN IV KIT
10.2000 | PACK | Freq: Once | INTRAVENOUS | Status: AC | PRN
  Administered 2019-03-04: 10.2 via INTRAVENOUS
  Filled 2019-03-04: qty 11

## 2019-03-04 MED ORDER — REGADENOSON 0.4 MG/5ML IV SOLN
0.4000 mg | Freq: Once | INTRAVENOUS | Status: AC
  Administered 2019-03-04: 0.4 mg via INTRAVENOUS

## 2019-03-06 ENCOUNTER — Telehealth: Payer: Self-pay | Admitting: Cardiology

## 2019-03-06 NOTE — Telephone Encounter (Signed)
New Message ° ° °Patient is calling to obtain results of CT. Please call to discuss.  °

## 2019-03-06 NOTE — Telephone Encounter (Signed)
Attempted to call, sent a MyChart message.

## 2019-03-17 ENCOUNTER — Encounter: Payer: Self-pay | Admitting: Cardiology

## 2019-03-17 NOTE — Telephone Encounter (Signed)
Patient is calling for CT results.

## 2019-03-17 NOTE — Telephone Encounter (Signed)
See MyChart encounter

## 2019-03-18 DIAGNOSIS — I10 Essential (primary) hypertension: Secondary | ICD-10-CM | POA: Diagnosis not present

## 2019-03-18 DIAGNOSIS — Z0000 Encounter for general adult medical examination without abnormal findings: Secondary | ICD-10-CM | POA: Diagnosis not present

## 2019-03-18 DIAGNOSIS — E7800 Pure hypercholesterolemia, unspecified: Secondary | ICD-10-CM | POA: Diagnosis not present

## 2019-03-18 DIAGNOSIS — E119 Type 2 diabetes mellitus without complications: Secondary | ICD-10-CM | POA: Diagnosis not present

## 2019-04-16 DIAGNOSIS — L237 Allergic contact dermatitis due to plants, except food: Secondary | ICD-10-CM | POA: Diagnosis not present

## 2019-04-16 DIAGNOSIS — Z6835 Body mass index (BMI) 35.0-35.9, adult: Secondary | ICD-10-CM | POA: Diagnosis not present

## 2019-04-16 DIAGNOSIS — E119 Type 2 diabetes mellitus without complications: Secondary | ICD-10-CM | POA: Diagnosis not present

## 2019-05-14 DIAGNOSIS — M542 Cervicalgia: Secondary | ICD-10-CM | POA: Diagnosis not present

## 2019-05-16 ENCOUNTER — Other Ambulatory Visit: Payer: Self-pay | Admitting: Orthopedic Surgery

## 2019-05-16 DIAGNOSIS — R221 Localized swelling, mass and lump, neck: Secondary | ICD-10-CM

## 2019-06-06 ENCOUNTER — Other Ambulatory Visit: Payer: Self-pay

## 2019-06-06 ENCOUNTER — Ambulatory Visit
Admission: RE | Admit: 2019-06-06 | Discharge: 2019-06-06 | Disposition: A | Discharge status: Home / Self Care | Payer: BC Managed Care – PPO | Source: Ambulatory Visit | Attending: Orthopedic Surgery | Admitting: Orthopedic Surgery

## 2019-06-06 DIAGNOSIS — M50222 Other cervical disc displacement at C5-C6 level: Secondary | ICD-10-CM | POA: Diagnosis not present

## 2019-06-06 DIAGNOSIS — R221 Localized swelling, mass and lump, neck: Secondary | ICD-10-CM

## 2019-06-06 MED ORDER — GADOBENATE DIMEGLUMINE 529 MG/ML IV SOLN
20.0000 mL | Freq: Once | INTRAVENOUS | Status: AC | PRN
  Administered 2019-06-06: 20 mL via INTRAVENOUS

## 2019-06-11 DIAGNOSIS — M542 Cervicalgia: Secondary | ICD-10-CM | POA: Diagnosis not present

## 2019-06-12 DIAGNOSIS — M25461 Effusion, right knee: Secondary | ICD-10-CM | POA: Diagnosis not present

## 2019-06-12 DIAGNOSIS — M1711 Unilateral primary osteoarthritis, right knee: Secondary | ICD-10-CM | POA: Diagnosis not present

## 2019-06-16 DIAGNOSIS — M50822 Other cervical disc disorders at C5-C6 level: Secondary | ICD-10-CM | POA: Diagnosis not present

## 2019-06-16 DIAGNOSIS — M542 Cervicalgia: Secondary | ICD-10-CM | POA: Diagnosis not present

## 2019-06-16 DIAGNOSIS — M6281 Muscle weakness (generalized): Secondary | ICD-10-CM | POA: Diagnosis not present

## 2019-06-20 DIAGNOSIS — M25511 Pain in right shoulder: Secondary | ICD-10-CM | POA: Diagnosis not present

## 2019-06-24 DIAGNOSIS — G8918 Other acute postprocedural pain: Secondary | ICD-10-CM | POA: Diagnosis not present

## 2019-06-24 DIAGNOSIS — Y999 Unspecified external cause status: Secondary | ICD-10-CM | POA: Diagnosis not present

## 2019-06-24 DIAGNOSIS — S42121A Displaced fracture of acromial process, right shoulder, initial encounter for closed fracture: Secondary | ICD-10-CM | POA: Diagnosis not present

## 2019-06-30 DIAGNOSIS — Z9889 Other specified postprocedural states: Secondary | ICD-10-CM | POA: Diagnosis not present

## 2019-08-08 DIAGNOSIS — Z9889 Other specified postprocedural states: Secondary | ICD-10-CM | POA: Diagnosis not present

## 2019-08-14 DIAGNOSIS — Z20828 Contact with and (suspected) exposure to other viral communicable diseases: Secondary | ICD-10-CM | POA: Diagnosis not present

## 2019-09-19 DIAGNOSIS — M25532 Pain in left wrist: Secondary | ICD-10-CM | POA: Diagnosis not present

## 2019-09-19 DIAGNOSIS — Z9889 Other specified postprocedural states: Secondary | ICD-10-CM | POA: Diagnosis not present

## 2019-09-22 DIAGNOSIS — I10 Essential (primary) hypertension: Secondary | ICD-10-CM | POA: Diagnosis not present

## 2019-09-22 DIAGNOSIS — R809 Proteinuria, unspecified: Secondary | ICD-10-CM | POA: Diagnosis not present

## 2019-09-22 DIAGNOSIS — E119 Type 2 diabetes mellitus without complications: Secondary | ICD-10-CM | POA: Diagnosis not present

## 2019-09-22 DIAGNOSIS — E7800 Pure hypercholesterolemia, unspecified: Secondary | ICD-10-CM | POA: Diagnosis not present

## 2019-09-24 ENCOUNTER — Other Ambulatory Visit: Payer: Self-pay | Admitting: Orthopedic Surgery

## 2019-09-24 DIAGNOSIS — M25532 Pain in left wrist: Secondary | ICD-10-CM

## 2019-10-20 ENCOUNTER — Ambulatory Visit
Admission: RE | Admit: 2019-10-20 | Discharge: 2019-10-20 | Disposition: A | Discharge status: Home / Self Care | Payer: BC Managed Care – PPO | Source: Ambulatory Visit | Attending: Orthopedic Surgery | Admitting: Orthopedic Surgery

## 2019-10-20 ENCOUNTER — Other Ambulatory Visit: Payer: Self-pay

## 2019-10-20 DIAGNOSIS — M25532 Pain in left wrist: Secondary | ICD-10-CM

## 2019-10-20 MED ORDER — IOPAMIDOL (ISOVUE-M 200) INJECTION 41%
2.0000 mL | Freq: Once | INTRAMUSCULAR | Status: AC
  Administered 2019-10-20: 16:00:00 2 mL via INTRA_ARTICULAR

## 2019-10-25 DIAGNOSIS — M25532 Pain in left wrist: Secondary | ICD-10-CM | POA: Diagnosis not present

## 2019-11-28 DIAGNOSIS — E7800 Pure hypercholesterolemia, unspecified: Secondary | ICD-10-CM | POA: Diagnosis not present

## 2019-11-28 DIAGNOSIS — E119 Type 2 diabetes mellitus without complications: Secondary | ICD-10-CM | POA: Diagnosis not present

## 2020-02-01 DIAGNOSIS — I10 Essential (primary) hypertension: Secondary | ICD-10-CM | POA: Diagnosis not present

## 2020-02-01 DIAGNOSIS — S70262A Insect bite (nonvenomous), left hip, initial encounter: Secondary | ICD-10-CM | POA: Diagnosis not present

## 2020-02-01 DIAGNOSIS — W57XXXA Bitten or stung by nonvenomous insect and other nonvenomous arthropods, initial encounter: Secondary | ICD-10-CM | POA: Diagnosis not present

## 2020-02-01 DIAGNOSIS — S50862A Insect bite (nonvenomous) of left forearm, initial encounter: Secondary | ICD-10-CM | POA: Diagnosis not present

## 2020-02-03 DIAGNOSIS — R519 Headache, unspecified: Secondary | ICD-10-CM | POA: Diagnosis not present

## 2020-02-03 DIAGNOSIS — R809 Proteinuria, unspecified: Secondary | ICD-10-CM | POA: Diagnosis not present

## 2020-02-03 DIAGNOSIS — I10 Essential (primary) hypertension: Secondary | ICD-10-CM | POA: Diagnosis not present

## 2020-02-03 DIAGNOSIS — E119 Type 2 diabetes mellitus without complications: Secondary | ICD-10-CM | POA: Diagnosis not present

## 2020-03-03 DIAGNOSIS — I10 Essential (primary) hypertension: Secondary | ICD-10-CM | POA: Diagnosis not present

## 2020-03-03 DIAGNOSIS — E119 Type 2 diabetes mellitus without complications: Secondary | ICD-10-CM | POA: Diagnosis not present

## 2020-03-15 DIAGNOSIS — Z7984 Long term (current) use of oral hypoglycemic drugs: Secondary | ICD-10-CM | POA: Diagnosis not present

## 2020-03-15 DIAGNOSIS — H35033 Hypertensive retinopathy, bilateral: Secondary | ICD-10-CM | POA: Diagnosis not present

## 2020-03-15 DIAGNOSIS — E119 Type 2 diabetes mellitus without complications: Secondary | ICD-10-CM | POA: Diagnosis not present

## 2020-03-22 DIAGNOSIS — Z0000 Encounter for general adult medical examination without abnormal findings: Secondary | ICD-10-CM | POA: Diagnosis not present

## 2020-07-19 DIAGNOSIS — J309 Allergic rhinitis, unspecified: Secondary | ICD-10-CM | POA: Diagnosis not present

## 2020-07-19 DIAGNOSIS — J018 Other acute sinusitis: Secondary | ICD-10-CM | POA: Diagnosis not present

## 2020-07-19 DIAGNOSIS — R059 Cough, unspecified: Secondary | ICD-10-CM | POA: Diagnosis not present

## 2020-07-20 DIAGNOSIS — Z20822 Contact with and (suspected) exposure to covid-19: Secondary | ICD-10-CM | POA: Diagnosis not present

## 2020-08-04 DIAGNOSIS — E7800 Pure hypercholesterolemia, unspecified: Secondary | ICD-10-CM | POA: Diagnosis not present

## 2020-08-04 DIAGNOSIS — E1129 Type 2 diabetes mellitus with other diabetic kidney complication: Secondary | ICD-10-CM | POA: Diagnosis not present

## 2020-08-04 DIAGNOSIS — Z125 Encounter for screening for malignant neoplasm of prostate: Secondary | ICD-10-CM | POA: Diagnosis not present

## 2020-10-07 DIAGNOSIS — R0683 Snoring: Secondary | ICD-10-CM | POA: Diagnosis not present

## 2020-10-07 DIAGNOSIS — R195 Other fecal abnormalities: Secondary | ICD-10-CM | POA: Diagnosis not present

## 2020-10-07 DIAGNOSIS — R002 Palpitations: Secondary | ICD-10-CM | POA: Diagnosis not present

## 2020-10-07 DIAGNOSIS — N529 Male erectile dysfunction, unspecified: Secondary | ICD-10-CM | POA: Diagnosis not present

## 2020-10-08 DIAGNOSIS — M25512 Pain in left shoulder: Secondary | ICD-10-CM | POA: Diagnosis not present

## 2020-10-19 DIAGNOSIS — S161XXD Strain of muscle, fascia and tendon at neck level, subsequent encounter: Secondary | ICD-10-CM | POA: Diagnosis not present

## 2020-11-11 ENCOUNTER — Ambulatory Visit: Payer: BC Managed Care – PPO | Admitting: Cardiology

## 2020-11-19 ENCOUNTER — Telehealth: Payer: Self-pay | Admitting: Cardiology

## 2020-11-19 NOTE — Telephone Encounter (Signed)
Left message for patient to call back  

## 2020-11-19 NOTE — Telephone Encounter (Signed)
   Pt c/o BP issue: STAT if pt c/o blurred vision, one-sided weakness or slurred speech  1. What are your last 5 BP readings? 190/90, 180/80  2. Are you having any other symptoms (ex. Dizziness, headache, blurred vision, passed out)?   3. What is your BP issue?  Pt said after he cancelled his appt after a week he felt his palpitations again and his BP is elevated, he would like to get an appt. First available is on 04/12 with APP but he said he would like to get in sooner than that

## 2020-11-23 DIAGNOSIS — S161XXD Strain of muscle, fascia and tendon at neck level, subsequent encounter: Secondary | ICD-10-CM | POA: Diagnosis not present

## 2020-11-23 DIAGNOSIS — Z01812 Encounter for preprocedural laboratory examination: Secondary | ICD-10-CM | POA: Diagnosis not present

## 2020-11-24 NOTE — Telephone Encounter (Signed)
Spoke with the patient who states that when he called he had taken his blood pressure that morning and it was elevated in the 180s-190s/80s. He had not taken his blood pressure medications yet but he was concerned. He states that ever since his blood pressure has been 130-140s/70s. He states that he has been feeling good. Offered to schedule the patient an appointment but he declined. He states that he has a sleep study scheduled for next week so he would like to wait and see if that shows sleep apnea and follow up after that if needed.

## 2020-11-26 DIAGNOSIS — K648 Other hemorrhoids: Secondary | ICD-10-CM | POA: Diagnosis not present

## 2020-11-26 DIAGNOSIS — Z1211 Encounter for screening for malignant neoplasm of colon: Secondary | ICD-10-CM | POA: Diagnosis not present

## 2020-12-03 DIAGNOSIS — I10 Essential (primary) hypertension: Secondary | ICD-10-CM | POA: Diagnosis not present

## 2020-12-03 DIAGNOSIS — G4733 Obstructive sleep apnea (adult) (pediatric): Secondary | ICD-10-CM | POA: Diagnosis not present

## 2020-12-03 DIAGNOSIS — E119 Type 2 diabetes mellitus without complications: Secondary | ICD-10-CM | POA: Diagnosis not present

## 2020-12-03 DIAGNOSIS — E669 Obesity, unspecified: Secondary | ICD-10-CM | POA: Diagnosis not present

## 2021-01-14 ENCOUNTER — Emergency Department (HOSPITAL_COMMUNITY): Payer: BC Managed Care – PPO

## 2021-01-14 ENCOUNTER — Encounter (HOSPITAL_COMMUNITY): Payer: Self-pay

## 2021-01-14 ENCOUNTER — Other Ambulatory Visit: Payer: Self-pay

## 2021-01-14 ENCOUNTER — Emergency Department (HOSPITAL_COMMUNITY)
Admission: EM | Admit: 2021-01-14 | Discharge: 2021-01-15 | Disposition: A | Discharge status: Home / Self Care | Payer: BC Managed Care – PPO | Attending: Emergency Medicine | Admitting: Emergency Medicine

## 2021-01-14 DIAGNOSIS — M25512 Pain in left shoulder: Secondary | ICD-10-CM | POA: Diagnosis not present

## 2021-01-14 DIAGNOSIS — R531 Weakness: Secondary | ICD-10-CM | POA: Insufficient documentation

## 2021-01-14 DIAGNOSIS — R001 Bradycardia, unspecified: Secondary | ICD-10-CM | POA: Diagnosis not present

## 2021-01-14 DIAGNOSIS — R0789 Other chest pain: Secondary | ICD-10-CM | POA: Diagnosis not present

## 2021-01-14 DIAGNOSIS — R079 Chest pain, unspecified: Secondary | ICD-10-CM

## 2021-01-14 DIAGNOSIS — Z7984 Long term (current) use of oral hypoglycemic drugs: Secondary | ICD-10-CM | POA: Diagnosis not present

## 2021-01-14 DIAGNOSIS — E119 Type 2 diabetes mellitus without complications: Secondary | ICD-10-CM | POA: Insufficient documentation

## 2021-01-14 DIAGNOSIS — I10 Essential (primary) hypertension: Secondary | ICD-10-CM | POA: Diagnosis not present

## 2021-01-14 DIAGNOSIS — Z7982 Long term (current) use of aspirin: Secondary | ICD-10-CM | POA: Diagnosis not present

## 2021-01-14 DIAGNOSIS — Z79899 Other long term (current) drug therapy: Secondary | ICD-10-CM | POA: Insufficient documentation

## 2021-01-14 DIAGNOSIS — R197 Diarrhea, unspecified: Secondary | ICD-10-CM | POA: Diagnosis not present

## 2021-01-14 DIAGNOSIS — R42 Dizziness and giddiness: Secondary | ICD-10-CM | POA: Diagnosis not present

## 2021-01-14 LAB — URINALYSIS, ROUTINE W REFLEX MICROSCOPIC
Bilirubin Urine: NEGATIVE
Glucose, UA: NEGATIVE mg/dL
Hgb urine dipstick: NEGATIVE
Ketones, ur: NEGATIVE mg/dL
Leukocytes,Ua: NEGATIVE
Nitrite: NEGATIVE
Protein, ur: NEGATIVE mg/dL
Specific Gravity, Urine: 1.02 (ref 1.005–1.030)
pH: 5 (ref 5.0–8.0)

## 2021-01-14 LAB — COMPREHENSIVE METABOLIC PANEL
ALT: 39 U/L (ref 0–44)
AST: 23 U/L (ref 15–41)
Albumin: 3.8 g/dL (ref 3.5–5.0)
Alkaline Phosphatase: 46 U/L (ref 38–126)
Anion gap: 7 (ref 5–15)
BUN: 16 mg/dL (ref 6–20)
CO2: 26 mmol/L (ref 22–32)
Calcium: 9.4 mg/dL (ref 8.9–10.3)
Chloride: 104 mmol/L (ref 98–111)
Creatinine, Ser: 1.13 mg/dL (ref 0.61–1.24)
GFR, Estimated: >60 mL/min (ref >60)
Glucose, Bld: 153 mg/dL — ABNORMAL HIGH (ref 70–99)
Potassium: 4.3 mmol/L (ref 3.5–5.1)
Sodium: 137 mmol/L (ref 135–145)
Total Bilirubin: 0.6 mg/dL (ref 0.3–1.2)
Total Protein: 6.9 g/dL (ref 6.5–8.1)

## 2021-01-14 LAB — CBC WITH DIFFERENTIAL/PLATELET
Abs Immature Granulocytes: 0.04 10*3/uL (ref 0.00–0.07)
Basophils Absolute: 0.1 10*3/uL (ref 0.0–0.1)
Basophils Relative: 1 %
Eosinophils Absolute: 0.2 10*3/uL (ref 0.0–0.5)
Eosinophils Relative: 2 %
HCT: 45.5 % (ref 39.0–52.0)
Hemoglobin: 15 g/dL (ref 13.0–17.0)
Immature Granulocytes: 0 %
Lymphocytes Relative: 28 %
Lymphs Abs: 2.8 10*3/uL (ref 0.7–4.0)
MCH: 29.9 pg (ref 26.0–34.0)
MCHC: 33 g/dL (ref 30.0–36.0)
MCV: 90.6 fL (ref 80.0–100.0)
Monocytes Absolute: 1 10*3/uL (ref 0.1–1.0)
Monocytes Relative: 10 %
Neutro Abs: 5.9 10*3/uL (ref 1.7–7.7)
Neutrophils Relative %: 59 %
Platelets: 324 10*3/uL (ref 150–400)
RBC: 5.02 MIL/uL (ref 4.22–5.81)
RDW: 13.2 % (ref 11.5–15.5)
WBC: 10.1 10*3/uL (ref 4.0–10.5)
nRBC: 0 % (ref 0.0–0.2)

## 2021-01-14 LAB — TROPONIN I (HIGH SENSITIVITY): Troponin I (High Sensitivity): 4 ng/L (ref <18)

## 2021-01-14 LAB — LIPASE, BLOOD: Lipase: 35 U/L (ref 11–51)

## 2021-01-14 NOTE — ED Provider Notes (Signed)
Emergency Medicine Provider Triage Evaluation Note  Micheal Delgado , a 50 y.o. male  was evaluated in triage.  Pt complains of lightheadedness, nausea, diarrhea, chest discomfort x3 days.  Today patient presents with pain to left shoulder with radiation to left arm.  Chest discomfort is midsternal, intermittent, described as slight pressure, 2/10 on pain scale.  No chest discomfort or left shoulder pain at present.  Patient came to emergency room because he was concerned that left shoulder pain may be radiation of chest pain.  Review of Systems  Positive: Nausea, diarrhea, chest discomfort, nausea lightheadedness, left shoulder pain, chills, lower abdominal pain Negative: Fevers, blood in stool, melena, dysuria, hematuria facial asymmetry, slurred speech, weakness, numbness  Physical Exam  BP 131/76 (BP Location: Left Arm)   Pulse (!) 57   Temp 97.7 F (36.5 C) (Oral)   Resp 16   SpO2 93%  Gen:   Awake, no distress   Resp:  Normal effort, able to speak in full complete sentences, lungs clear to auscultation bilaterally MSK:   Moves extremities without difficulty  Other:  Abdomen soft, nondistended, nontender  Medical Decision Making  Medically screening exam initiated at 9:58 PM.  Appropriate orders placed.  Micheal Delgado was informed that the remainder of the evaluation will be completed by another provider, this initial triage assessment does not replace that evaluation, and the importance of remaining in the ED until their evaluation is complete.  The patient appears stable so that the remainder of the work up may be completed by another provider.      Berneice Heinrich 01/14/21 2204    Terrilee Files, MD 01/15/21 1239

## 2021-01-14 NOTE — ED Triage Notes (Signed)
Patient reports dizziness, nausea, and diarrhea starting Wednesday, started with chest discomfort with L shoulder and elbow pain, chills yesterday.

## 2021-01-15 LAB — TROPONIN I (HIGH SENSITIVITY): Troponin I (High Sensitivity): 4 ng/L (ref <18)

## 2021-01-15 NOTE — ED Provider Notes (Signed)
[facility] EMERGENCY DEPARTMENT Provider Note   CSN: [id number] Arrival date & time: 01/14/21  2025     History Chief Complaint  Patient presents with  . Dizziness  . Chest Pain    Micheal Delgado is a 50 y.o. male.  Patient presents to the emergency department for evaluation of chest pain.  He reports that he has not been feeling well for the last couple of days.  He has had some generalized weakness and an upset stomach including frequent loose stools.  No nausea or vomiting.  No specific abdominal pain.  Today he noticed some pain in the area of his left scapula that radiated to the left arm.  He then developed slight discomfort in his chest.  He reports that symptoms lasted for several hours and are now much improved without any intervention.       HPI: A 50 year old patient with a history of treated diabetes, hypertension, hypercholesterolemia and obesity presents for evaluation of chest pain. Initial onset of pain was more than 6 hours ago. The patient's chest pain is described as heaviness/pressure/tightness and is not worse with exertion. The patient's chest pain is middle- or left-sided, is not well-localized, is not sharp and does radiate to the arms/jaw/neck. The patient does not complain of nausea and denies diaphoresis. The patient has a family history of coronary artery disease in a first-degree relative with onset less than age 53. The patient has no history of stroke, has no history of peripheral artery disease and has not smoked in the past 90 days.   Past Medical History:  Diagnosis Date  . Diabetes mellitus without complication (HCC)   . Gout   . Hyperlipidemia   . Hypertension     Patient Active Problem List   Diagnosis Date Noted  . Hypertension   . Chest pain 10/21/2018  . Benign essential HTN 10/21/2018  . Hyperlipidemia LDL goal <70 10/21/2018  . DM (diabetes mellitus), type 2 (HCC) 10/21/2018  . Pilonidal cyst with abscess 03/02/2014     Past Surgical History:  Procedure Laterality Date  . KNEE ARTHROCENTESIS  2010   right  . TONSILLECTOMY AND ADENOIDECTOMY         Family History  Problem Relation Age of Onset  . Heart disease Other     Social History   Tobacco Use  . Smoking status: Never Smoker  . Smokeless tobacco: Never Used  Vaping Use  . Vaping Use: Never used  Substance Use Topics  . Alcohol use: No  . Drug use: No    Home Medications Prior to Admission medications   Medication Sig Start Date End Date Taking? Authorizing Provider  amLODipine (NORVASC) 2.5 MG tablet Take 2.5 mg by mouth daily. 09/29/18   [provider]  aspirin EC 81 MG tablet Take 81 mg by mouth daily.    [provider]  fluticasone (FLONASE) 50 MCG/ACT nasal spray Place 1-2 sprays into both nostrils daily as needed (for seasonal allergies).     [provider]  ibuprofen (ADVIL,MOTRIN) 200 MG tablet Take 400 mg by mouth every 8 (eight) hours as needed (for pain).    [provider]  lisinopril (PRINIVIL,ZESTRIL) 40 MG tablet Take 40 mg by mouth daily.    [provider]  metFORMIN (GLUCOPHAGE) 500 MG tablet Take 1,000 mg by mouth 2 (two) times daily with a meal.     [provider]  pantoprazole (PROTONIX) 40 MG tablet Take 1 tablet (40 mg total) by  mouth daily. 10/19/18   Gilda Crease, MD    Allergies    Patient has no known allergies.  Review of Systems   Review of Systems  Respiratory: Negative for shortness of breath.   Cardiovascular: Positive for chest pain.  Gastrointestinal: Positive for diarrhea.  All other systems reviewed and are negative.   Physical Exam Updated Vital Signs BP 131/81   Pulse (!) 50   Temp 97.7 F (36.5 C) (Oral)   Resp 18   Ht 6' 1.5" (1.867 m)   Wt 121.1 kg   SpO2 98%   BMI 34.75 kg/m   Physical Exam Vitals and nursing note reviewed.  Constitutional:      General: He is not in acute distress.    Appearance:  Normal appearance. He is well-developed.  HENT:     Head: Normocephalic and atraumatic.     Right Ear: Hearing normal.     Left Ear: Hearing normal.     Nose: Nose normal.  Eyes:     Conjunctiva/sclera: Conjunctivae normal.     Pupils: Pupils are equal, round, and reactive to light.  Cardiovascular:     Rate and Rhythm: Regular rhythm.     Heart sounds: S1 normal and S2 normal. No murmur heard. No friction rub. No gallop.   Pulmonary:     Effort: Pulmonary effort is normal. No respiratory distress.     Breath sounds: Normal breath sounds.  Chest:     Chest wall: No tenderness.  Abdominal:     General: Bowel sounds are normal.     Palpations: Abdomen is soft.     Tenderness: There is no abdominal tenderness. There is no guarding or rebound. Negative signs include Murphy's sign and McBurney's sign.     Hernia: No hernia is present.  Musculoskeletal:        General: Normal range of motion.     Cervical back: Normal range of motion and neck supple.  Skin:    General: Skin is warm and dry.     Findings: No rash.  Neurological:     Mental Status: He is alert and oriented to person, place, and time.     GCS: GCS eye subscore is 4. GCS verbal subscore is 5. GCS motor subscore is 6.     Cranial Nerves: No cranial nerve deficit.     Sensory: No sensory deficit.     Coordination: Coordination normal.  Psychiatric:        Speech: Speech normal.        Behavior: Behavior normal.        Thought Content: Thought content normal.     ED Results / Procedures / Treatments   Labs (all labs ordered are listed, but only abnormal results are displayed) Labs Reviewed  COMPREHENSIVE METABOLIC PANEL - Abnormal; Notable for the following components:      Result Value   Glucose, Bld 153 (*)    All other components within normal limits  URINALYSIS, ROUTINE W REFLEX MICROSCOPIC  LIPASE, BLOOD  CBC WITH DIFFERENTIAL/PLATELET  CBC  TROPONIN I (HIGH SENSITIVITY)  TROPONIN I (HIGH SENSITIVITY)     EKG EKG Interpretation  Date/Time:  Friday Jan 14 2021 21:38:08 EDT Ventricular Rate:  51 PR Interval:  170 QRS Duration: 100 QT Interval:  426 QTC Calculation: 392 R Axis:   73 Text Interpretation: Sinus bradycardia Otherwise normal ECG Confirmed by Gilda Crease [phone]) on 01/15/2021 2:39:08 AM   Radiology DG Chest [facility] 1 View  Result  Date: 01/14/2021 CLINICAL DATA:  Chest pain.  Dizziness. EXAM: PORTABLE CHEST 1 VIEW COMPARISON:  Chest x-ray 10/18/2018, CT cardiac 03/04/2019 FINDINGS: The heart size and mediastinal contours are unchanged. Similar elevated left hemidiaphragm. No focal consolidation. No pulmonary edema. No pleural effusion. No pneumothorax. No acute osseous abnormality. IMPRESSION: No active disease. Electronically Signed   By: Tish Frederickson M.D.   On: 01/14/2021 22:35    Procedures Procedures   Medications Ordered in ED Medications - No data to display  ED Course  I have reviewed the triage vital signs and the nursing notes.  Pertinent labs & imaging results that were available during my care of the patient were reviewed by me and considered in my medical decision making (see chart for details).    MDM Rules/Calculators/A&P HEAR Score: 4                        Patient presents to the emergency department for evaluation of chest pain.  Patient does have multiple cardiac risk factors.  He did, however, have a cardiac work-up several years ago including cardiac CT and nuclear medicine stress test that did not show any significant abnormality.  Patient reports that he is active, rides his bike 20 miles at least 3 times a week and never develops any exertional chest pain.  Neither parent with CAD but he had a half brother that died of cardiac arrhythmia.  Work-up today is reassuring.  He currently is trying to wean himself off Prilosec and some of his symptoms might be related to that.  He does have a cardiologist.  He is felt to be low risk for cardiac  etiology of his symptoms today and therefore will be discharged and have follow-up with cardiology as soon as possible.  Return for worsening symptoms.  Final Clinical Impression(s) / ED Diagnoses Final diagnoses:  Chest pain, unspecified type    Rx / DC Orders ED Discharge Orders    None       Gilda Crease, MD 01/15/21 [phone]

## 2021-02-18 DIAGNOSIS — R110 Nausea: Secondary | ICD-10-CM | POA: Diagnosis not present

## 2021-02-18 DIAGNOSIS — R82998 Other abnormal findings in urine: Secondary | ICD-10-CM | POA: Diagnosis not present

## 2021-02-21 DIAGNOSIS — R809 Proteinuria, unspecified: Secondary | ICD-10-CM | POA: Diagnosis not present

## 2021-02-21 DIAGNOSIS — R110 Nausea: Secondary | ICD-10-CM | POA: Diagnosis not present

## 2021-02-21 DIAGNOSIS — I10 Essential (primary) hypertension: Secondary | ICD-10-CM | POA: Diagnosis not present

## 2021-02-21 DIAGNOSIS — E1129 Type 2 diabetes mellitus with other diabetic kidney complication: Secondary | ICD-10-CM | POA: Diagnosis not present

## 2021-02-21 DIAGNOSIS — E7800 Pure hypercholesterolemia, unspecified: Secondary | ICD-10-CM | POA: Diagnosis not present

## 2021-02-23 DIAGNOSIS — E7800 Pure hypercholesterolemia, unspecified: Secondary | ICD-10-CM | POA: Diagnosis not present

## 2021-02-23 DIAGNOSIS — I10 Essential (primary) hypertension: Secondary | ICD-10-CM | POA: Diagnosis not present

## 2021-02-23 DIAGNOSIS — Z0000 Encounter for general adult medical examination without abnormal findings: Secondary | ICD-10-CM | POA: Diagnosis not present

## 2021-02-23 DIAGNOSIS — E1129 Type 2 diabetes mellitus with other diabetic kidney complication: Secondary | ICD-10-CM | POA: Diagnosis not present

## 2021-03-12 DIAGNOSIS — L255 Unspecified contact dermatitis due to plants, except food: Secondary | ICD-10-CM | POA: Diagnosis not present

## 2021-03-14 DIAGNOSIS — R195 Other fecal abnormalities: Secondary | ICD-10-CM | POA: Diagnosis not present

## 2021-03-14 DIAGNOSIS — R110 Nausea: Secondary | ICD-10-CM | POA: Diagnosis not present

## 2021-03-14 DIAGNOSIS — K219 Gastro-esophageal reflux disease without esophagitis: Secondary | ICD-10-CM | POA: Diagnosis not present

## 2021-03-30 DIAGNOSIS — G4733 Obstructive sleep apnea (adult) (pediatric): Secondary | ICD-10-CM | POA: Diagnosis not present

## 2021-04-14 DIAGNOSIS — Z7984 Long term (current) use of oral hypoglycemic drugs: Secondary | ICD-10-CM | POA: Diagnosis not present

## 2021-04-14 DIAGNOSIS — E119 Type 2 diabetes mellitus without complications: Secondary | ICD-10-CM | POA: Diagnosis not present

## 2021-04-30 DIAGNOSIS — G4733 Obstructive sleep apnea (adult) (pediatric): Secondary | ICD-10-CM | POA: Diagnosis not present

## 2021-05-31 DIAGNOSIS — G4733 Obstructive sleep apnea (adult) (pediatric): Secondary | ICD-10-CM | POA: Diagnosis not present

## 2021-06-08 DIAGNOSIS — G4733 Obstructive sleep apnea (adult) (pediatric): Secondary | ICD-10-CM | POA: Diagnosis not present

## 2021-06-30 DIAGNOSIS — G4733 Obstructive sleep apnea (adult) (pediatric): Secondary | ICD-10-CM | POA: Diagnosis not present

## 2021-07-12 DIAGNOSIS — E7800 Pure hypercholesterolemia, unspecified: Secondary | ICD-10-CM | POA: Diagnosis not present

## 2021-07-12 DIAGNOSIS — E1129 Type 2 diabetes mellitus with other diabetic kidney complication: Secondary | ICD-10-CM | POA: Diagnosis not present

## 2021-07-13 DIAGNOSIS — R0902 Hypoxemia: Secondary | ICD-10-CM | POA: Diagnosis not present

## 2021-07-18 DIAGNOSIS — G4733 Obstructive sleep apnea (adult) (pediatric): Secondary | ICD-10-CM | POA: Diagnosis not present

## 2021-08-15 IMAGING — CR DG CHEST 1V PORT
1 series · 1 of 1 positions shown · non-contrast
Comparison: Chest x-ray 10/18/2018, CT cardiac 03/04/2019

CLINICAL DATA: Chest pain.  Dizziness.

EXAM:
PORTABLE CHEST 1 VIEW

[chest pa]
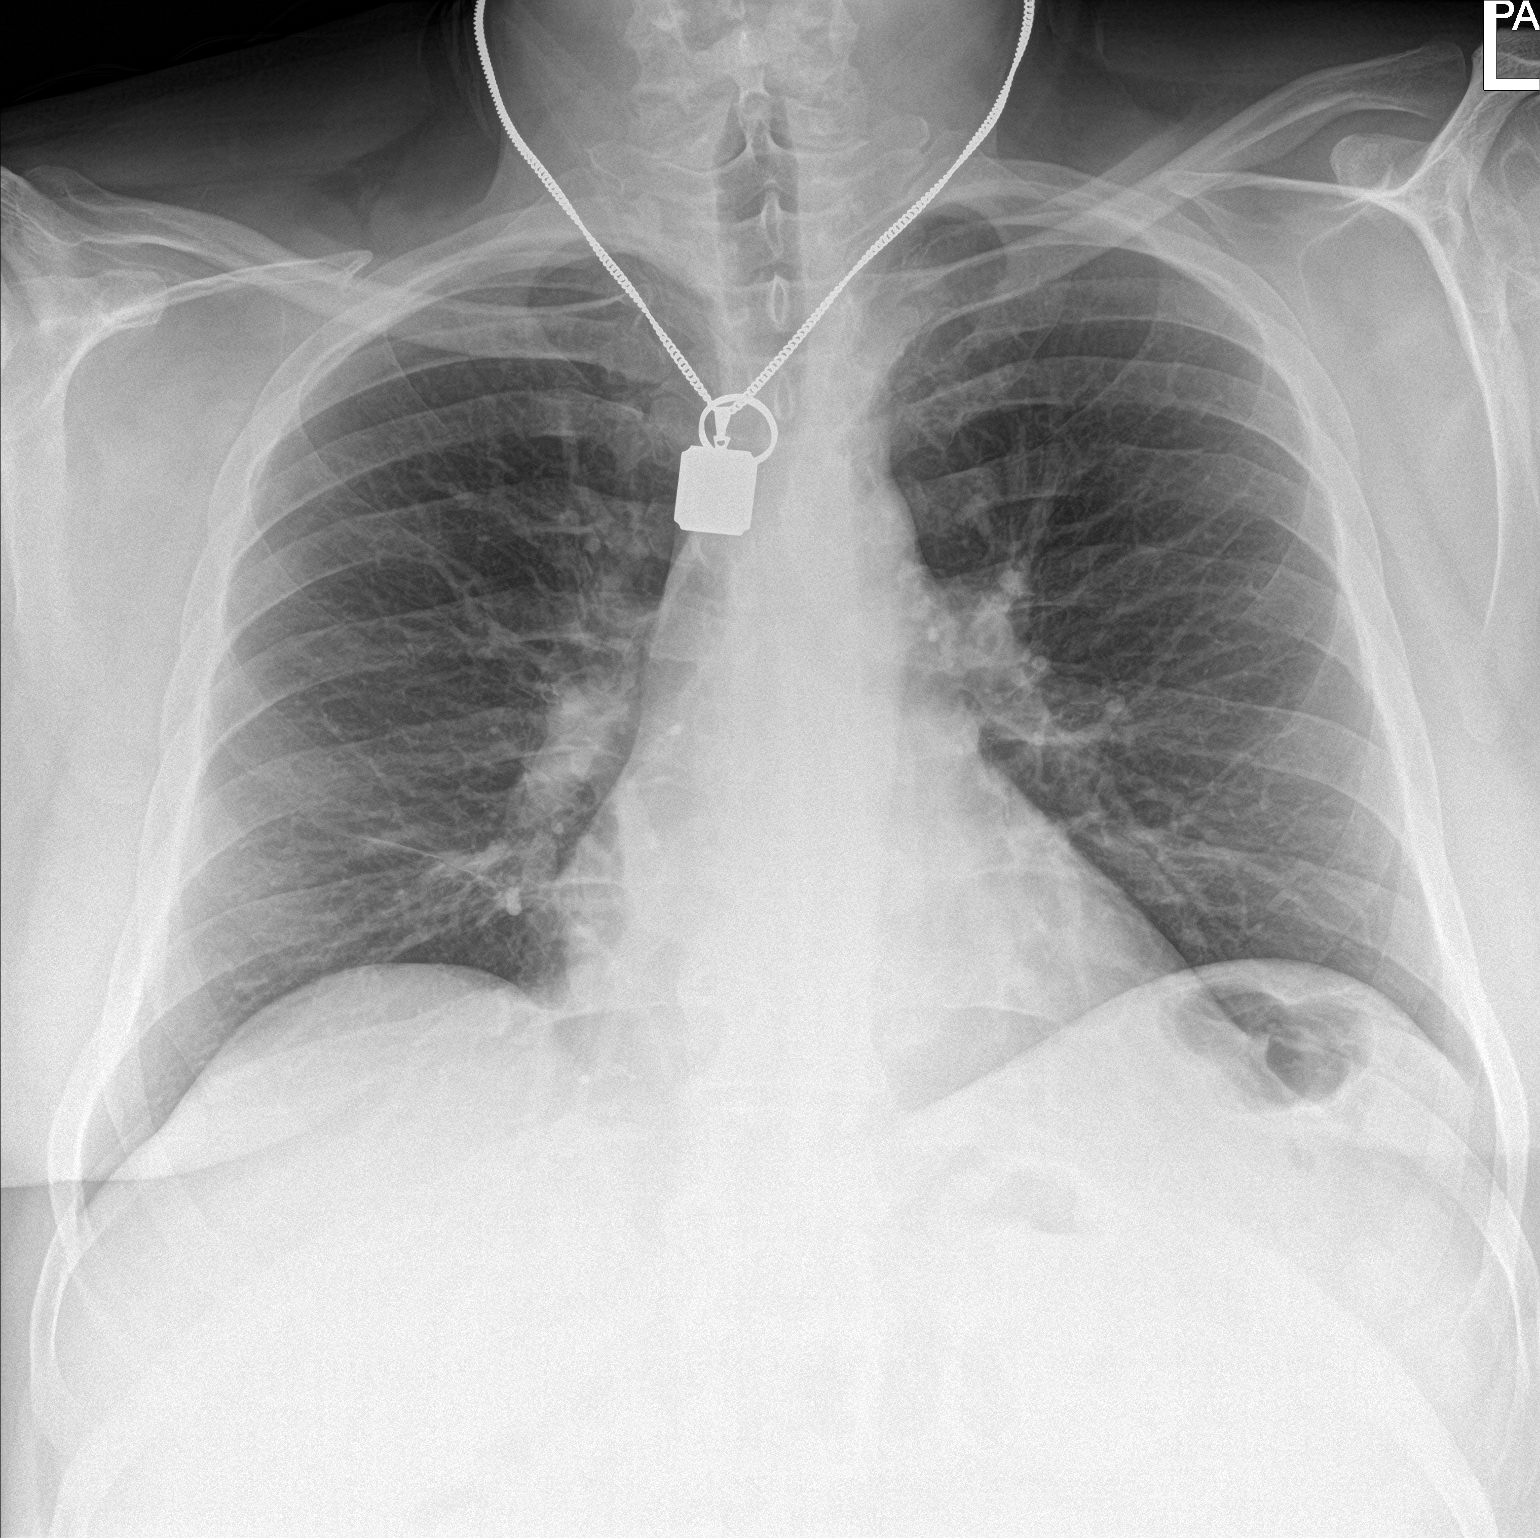

[1 of 1 positions shown; findings below may reference images not displayed]

FINDINGS: The heart size and mediastinal contours are unchanged. Similar
elevated left hemidiaphragm.

No focal consolidation. No pulmonary edema. No pleural effusion. No
pneumothorax.

No acute osseous abnormality.
IMPRESSION: No active disease.

## 2021-09-06 DIAGNOSIS — E1129 Type 2 diabetes mellitus with other diabetic kidney complication: Secondary | ICD-10-CM | POA: Diagnosis not present

## 2021-09-06 DIAGNOSIS — E7800 Pure hypercholesterolemia, unspecified: Secondary | ICD-10-CM | POA: Diagnosis not present

## 2021-09-06 DIAGNOSIS — I10 Essential (primary) hypertension: Secondary | ICD-10-CM | POA: Diagnosis not present

## 2021-09-06 DIAGNOSIS — G4733 Obstructive sleep apnea (adult) (pediatric): Secondary | ICD-10-CM | POA: Diagnosis not present

## 2021-09-27 DIAGNOSIS — M25562 Pain in left knee: Secondary | ICD-10-CM | POA: Diagnosis not present

## 2021-09-27 DIAGNOSIS — M7052 Other bursitis of knee, left knee: Secondary | ICD-10-CM | POA: Diagnosis not present

## 2021-12-07 DIAGNOSIS — E1129 Type 2 diabetes mellitus with other diabetic kidney complication: Secondary | ICD-10-CM | POA: Diagnosis not present

## 2021-12-12 DIAGNOSIS — M67911 Unspecified disorder of synovium and tendon, right shoulder: Secondary | ICD-10-CM | POA: Diagnosis not present

## 2021-12-13 DIAGNOSIS — S46011D Strain of muscle(s) and tendon(s) of the rotator cuff of right shoulder, subsequent encounter: Secondary | ICD-10-CM | POA: Diagnosis not present

## 2021-12-20 DIAGNOSIS — S46011D Strain of muscle(s) and tendon(s) of the rotator cuff of right shoulder, subsequent encounter: Secondary | ICD-10-CM | POA: Diagnosis not present

## 2022-05-12 DIAGNOSIS — M21612 Bunion of left foot: Secondary | ICD-10-CM | POA: Diagnosis not present

## 2022-05-12 DIAGNOSIS — M25572 Pain in left ankle and joints of left foot: Secondary | ICD-10-CM | POA: Diagnosis not present

## 2022-05-12 DIAGNOSIS — M65872 Other synovitis and tenosynovitis, left ankle and foot: Secondary | ICD-10-CM | POA: Diagnosis not present

## 2022-05-29 DIAGNOSIS — E1129 Type 2 diabetes mellitus with other diabetic kidney complication: Secondary | ICD-10-CM | POA: Diagnosis not present

## 2022-05-29 DIAGNOSIS — E7800 Pure hypercholesterolemia, unspecified: Secondary | ICD-10-CM | POA: Diagnosis not present

## 2022-05-29 DIAGNOSIS — Z125 Encounter for screening for malignant neoplasm of prostate: Secondary | ICD-10-CM | POA: Diagnosis not present

## 2022-06-27 DIAGNOSIS — J019 Acute sinusitis, unspecified: Secondary | ICD-10-CM | POA: Diagnosis not present

## 2022-06-27 DIAGNOSIS — Z6833 Body mass index (BMI) 33.0-33.9, adult: Secondary | ICD-10-CM | POA: Diagnosis not present

## 2022-06-27 DIAGNOSIS — J209 Acute bronchitis, unspecified: Secondary | ICD-10-CM | POA: Diagnosis not present

## 2022-07-03 DIAGNOSIS — J209 Acute bronchitis, unspecified: Secondary | ICD-10-CM | POA: Diagnosis not present

## 2022-07-07 DIAGNOSIS — R053 Chronic cough: Secondary | ICD-10-CM | POA: Diagnosis not present

## 2022-07-07 DIAGNOSIS — R0981 Nasal congestion: Secondary | ICD-10-CM | POA: Diagnosis not present

## 2022-07-07 DIAGNOSIS — Z6834 Body mass index (BMI) 34.0-34.9, adult: Secondary | ICD-10-CM | POA: Diagnosis not present

## 2022-07-07 DIAGNOSIS — J069 Acute upper respiratory infection, unspecified: Secondary | ICD-10-CM | POA: Diagnosis not present

## 2022-08-03 DIAGNOSIS — R809 Proteinuria, unspecified: Secondary | ICD-10-CM | POA: Diagnosis not present

## 2022-08-03 DIAGNOSIS — R350 Frequency of micturition: Secondary | ICD-10-CM | POA: Diagnosis not present

## 2022-08-03 DIAGNOSIS — E1129 Type 2 diabetes mellitus with other diabetic kidney complication: Secondary | ICD-10-CM | POA: Diagnosis not present

## 2022-08-17 DIAGNOSIS — E1129 Type 2 diabetes mellitus with other diabetic kidney complication: Secondary | ICD-10-CM | POA: Diagnosis not present

## 2022-09-11 DIAGNOSIS — M722 Plantar fascial fibromatosis: Secondary | ICD-10-CM | POA: Diagnosis not present

## 2022-09-11 DIAGNOSIS — M7672 Peroneal tendinitis, left leg: Secondary | ICD-10-CM | POA: Diagnosis not present

## 2022-09-11 DIAGNOSIS — M25572 Pain in left ankle and joints of left foot: Secondary | ICD-10-CM | POA: Diagnosis not present

## 2022-09-18 DIAGNOSIS — M79644 Pain in right finger(s): Secondary | ICD-10-CM | POA: Diagnosis not present

## 2022-09-25 DIAGNOSIS — M25572 Pain in left ankle and joints of left foot: Secondary | ICD-10-CM | POA: Diagnosis not present

## 2022-10-31 DIAGNOSIS — F4322 Adjustment disorder with anxiety: Secondary | ICD-10-CM | POA: Diagnosis not present

## 2022-11-06 DIAGNOSIS — F4322 Adjustment disorder with anxiety: Secondary | ICD-10-CM | POA: Diagnosis not present

## 2022-11-20 DIAGNOSIS — F4322 Adjustment disorder with anxiety: Secondary | ICD-10-CM | POA: Diagnosis not present

## 2022-12-04 DIAGNOSIS — E1129 Type 2 diabetes mellitus with other diabetic kidney complication: Secondary | ICD-10-CM | POA: Diagnosis not present

## 2022-12-04 DIAGNOSIS — F4322 Adjustment disorder with anxiety: Secondary | ICD-10-CM | POA: Diagnosis not present

## 2022-12-04 DIAGNOSIS — Z23 Encounter for immunization: Secondary | ICD-10-CM | POA: Diagnosis not present

## 2022-12-04 DIAGNOSIS — I10 Essential (primary) hypertension: Secondary | ICD-10-CM | POA: Diagnosis not present

## 2022-12-04 DIAGNOSIS — E7800 Pure hypercholesterolemia, unspecified: Secondary | ICD-10-CM | POA: Diagnosis not present

## 2022-12-18 DIAGNOSIS — F4322 Adjustment disorder with anxiety: Secondary | ICD-10-CM | POA: Diagnosis not present

## 2023-01-04 DIAGNOSIS — F4322 Adjustment disorder with anxiety: Secondary | ICD-10-CM | POA: Diagnosis not present

## 2023-01-15 DIAGNOSIS — F4322 Adjustment disorder with anxiety: Secondary | ICD-10-CM | POA: Diagnosis not present

## 2023-02-12 DIAGNOSIS — F4322 Adjustment disorder with anxiety: Secondary | ICD-10-CM | POA: Diagnosis not present

## 2023-02-26 DIAGNOSIS — F4322 Adjustment disorder with anxiety: Secondary | ICD-10-CM | POA: Diagnosis not present

## 2023-03-02 DIAGNOSIS — E1129 Type 2 diabetes mellitus with other diabetic kidney complication: Secondary | ICD-10-CM | POA: Diagnosis not present

## 2023-03-02 DIAGNOSIS — E7800 Pure hypercholesterolemia, unspecified: Secondary | ICD-10-CM | POA: Diagnosis not present

## 2023-03-05 DIAGNOSIS — E7800 Pure hypercholesterolemia, unspecified: Secondary | ICD-10-CM | POA: Diagnosis not present

## 2023-03-05 DIAGNOSIS — W57XXXA Bitten or stung by nonvenomous insect and other nonvenomous arthropods, initial encounter: Secondary | ICD-10-CM | POA: Diagnosis not present

## 2023-03-05 DIAGNOSIS — S30863A Insect bite (nonvenomous) of scrotum and testes, initial encounter: Secondary | ICD-10-CM | POA: Diagnosis not present

## 2023-03-05 DIAGNOSIS — E1129 Type 2 diabetes mellitus with other diabetic kidney complication: Secondary | ICD-10-CM | POA: Diagnosis not present

## 2023-03-16 DIAGNOSIS — E119 Type 2 diabetes mellitus without complications: Secondary | ICD-10-CM | POA: Diagnosis not present

## 2023-03-16 DIAGNOSIS — H52223 Regular astigmatism, bilateral: Secondary | ICD-10-CM | POA: Diagnosis not present

## 2023-03-16 DIAGNOSIS — Z7984 Long term (current) use of oral hypoglycemic drugs: Secondary | ICD-10-CM | POA: Diagnosis not present

## 2023-03-27 DIAGNOSIS — F4322 Adjustment disorder with anxiety: Secondary | ICD-10-CM | POA: Diagnosis not present

## 2023-04-11 DIAGNOSIS — M25511 Pain in right shoulder: Secondary | ICD-10-CM | POA: Diagnosis not present

## 2023-05-21 DIAGNOSIS — F4322 Adjustment disorder with anxiety: Secondary | ICD-10-CM | POA: Diagnosis not present

## 2023-06-04 DIAGNOSIS — E1129 Type 2 diabetes mellitus with other diabetic kidney complication: Secondary | ICD-10-CM | POA: Diagnosis not present

## 2023-06-04 DIAGNOSIS — E7800 Pure hypercholesterolemia, unspecified: Secondary | ICD-10-CM | POA: Diagnosis not present

## 2023-06-07 DIAGNOSIS — E7800 Pure hypercholesterolemia, unspecified: Secondary | ICD-10-CM | POA: Diagnosis not present

## 2023-06-07 DIAGNOSIS — Z23 Encounter for immunization: Secondary | ICD-10-CM | POA: Diagnosis not present

## 2023-06-07 DIAGNOSIS — I10 Essential (primary) hypertension: Secondary | ICD-10-CM | POA: Diagnosis not present

## 2023-06-07 DIAGNOSIS — Z0000 Encounter for general adult medical examination without abnormal findings: Secondary | ICD-10-CM | POA: Diagnosis not present

## 2023-06-07 DIAGNOSIS — E1129 Type 2 diabetes mellitus with other diabetic kidney complication: Secondary | ICD-10-CM | POA: Diagnosis not present

## 2023-06-18 DIAGNOSIS — F4322 Adjustment disorder with anxiety: Secondary | ICD-10-CM | POA: Diagnosis not present

## 2023-07-23 DIAGNOSIS — F4322 Adjustment disorder with anxiety: Secondary | ICD-10-CM | POA: Diagnosis not present

## 2023-09-06 DIAGNOSIS — M7711 Lateral epicondylitis, right elbow: Secondary | ICD-10-CM | POA: Diagnosis not present

## 2023-09-06 DIAGNOSIS — F4322 Adjustment disorder with anxiety: Secondary | ICD-10-CM | POA: Diagnosis not present

## 2023-09-06 DIAGNOSIS — M545 Low back pain, unspecified: Secondary | ICD-10-CM | POA: Diagnosis not present

## 2023-09-11 DIAGNOSIS — M25652 Stiffness of left hip, not elsewhere classified: Secondary | ICD-10-CM | POA: Diagnosis not present

## 2023-09-11 DIAGNOSIS — M545 Low back pain, unspecified: Secondary | ICD-10-CM | POA: Diagnosis not present

## 2023-09-17 DIAGNOSIS — M545 Low back pain, unspecified: Secondary | ICD-10-CM | POA: Diagnosis not present

## 2023-09-17 DIAGNOSIS — M25652 Stiffness of left hip, not elsewhere classified: Secondary | ICD-10-CM | POA: Diagnosis not present

## 2023-09-28 DIAGNOSIS — M21611 Bunion of right foot: Secondary | ICD-10-CM | POA: Diagnosis not present

## 2023-09-28 DIAGNOSIS — M21612 Bunion of left foot: Secondary | ICD-10-CM | POA: Diagnosis not present

## 2023-09-28 DIAGNOSIS — M7671 Peroneal tendinitis, right leg: Secondary | ICD-10-CM | POA: Diagnosis not present

## 2023-09-28 DIAGNOSIS — M25572 Pain in left ankle and joints of left foot: Secondary | ICD-10-CM | POA: Diagnosis not present

## 2023-09-28 DIAGNOSIS — M25571 Pain in right ankle and joints of right foot: Secondary | ICD-10-CM | POA: Diagnosis not present

## 2023-10-08 DIAGNOSIS — F4322 Adjustment disorder with anxiety: Secondary | ICD-10-CM | POA: Diagnosis not present

## 2023-11-05 DIAGNOSIS — F4322 Adjustment disorder with anxiety: Secondary | ICD-10-CM | POA: Diagnosis not present

## 2023-12-07 DIAGNOSIS — E1129 Type 2 diabetes mellitus with other diabetic kidney complication: Secondary | ICD-10-CM | POA: Diagnosis not present

## 2023-12-11 DIAGNOSIS — Z0000 Encounter for general adult medical examination without abnormal findings: Secondary | ICD-10-CM | POA: Diagnosis not present

## 2023-12-11 DIAGNOSIS — E7800 Pure hypercholesterolemia, unspecified: Secondary | ICD-10-CM | POA: Diagnosis not present

## 2023-12-11 DIAGNOSIS — E1129 Type 2 diabetes mellitus with other diabetic kidney complication: Secondary | ICD-10-CM | POA: Diagnosis not present

## 2023-12-11 DIAGNOSIS — I10 Essential (primary) hypertension: Secondary | ICD-10-CM | POA: Diagnosis not present

## 2024-01-07 DIAGNOSIS — F4322 Adjustment disorder with anxiety: Secondary | ICD-10-CM | POA: Diagnosis not present

## 2024-02-19 ENCOUNTER — Ambulatory Visit: Admitting: Podiatry

## 2024-02-19 ENCOUNTER — Encounter: Payer: Self-pay | Admitting: Podiatry

## 2024-02-19 DIAGNOSIS — L600 Ingrowing nail: Secondary | ICD-10-CM

## 2024-02-19 MED ORDER — MUPIROCIN 2 % EX OINT: 1.0000 | TOPICAL_OINTMENT | Freq: Two times a day (BID) | CUTANEOUS | 0 refills | Status: AC

## 2024-02-19 MED ORDER — MUPIROCIN 2 % EX OINT: 1.0000 | TOPICAL_OINTMENT | Freq: Two times a day (BID) | CUTANEOUS | 0 refills | Status: DC

## 2024-02-19 NOTE — Patient Instructions (Signed)

## 2024-02-19 NOTE — Progress Notes (Signed)
 Patient complains presented with ingrown toenail of the third nail left.  Has been bothering him for the past several days.  Has noticed drainage and bleeding from the nailbed.  Has been Sore.  Has Been Very Active the past Several Days.  No F/C or N/V   Physical exam:  General appearance: Pleasant, and in no acute distress. AOx3.  Vascular: Pedal pulses: DP 2/4 bilaterally, PT 2/4 bilaterally.  Minimal edema lower legs bilaterally. Capillary fill time immediate.  Neurological: Light touch intact feet bilaterally.  Normal Achilles reflex bilaterally.    Dermatologic:   Ingrown medial and lateral nail borders third nail left.  Dried blood present.  Several redness along proximal nail fold.  Nail somewhat lytic to nailbed with clear fluid present.  No purulence noted.  Musculoskeletal: Hammertoes 2 through 5 bilaterally tenderness distal to the.    Diagnosis: 1.  Ingrown nail both borders of third toenail left  Plan: -Established office visit level 3 for evaluation and management. -Just ingrown nail.  He has lot of things yesterday the next few days and would like to delay doing the procedure on it.  I would be fine have him soak it twice a day and Epsom warm Epsom salt water, apply Bactroban ointment, and a light dressing to it.  Will see him in and do a matrixectomy lateral nail border margin avulsion of the medial portion of the nail -Rx Bactroban ointment, apply to ingrown nail site twice daily after soaks. -Written oral care instructions given.  Return 1 week matrixectomy medial and lateral border third toe left

## 2024-02-28 ENCOUNTER — Ambulatory Visit: Admitting: Podiatry

## 2024-03-04 ENCOUNTER — Ambulatory Visit: Admitting: Podiatry

## 2024-03-04 ENCOUNTER — Encounter: Payer: Self-pay | Admitting: Podiatry

## 2024-03-04 DIAGNOSIS — L600 Ingrowing nail: Secondary | ICD-10-CM

## 2024-03-04 NOTE — Progress Notes (Signed)
 Patient complains of painful ingrown third toe both border(s) left. Patient denies fevers, chills, nausea, vomiting.  Objective:  Vitals: Reviewed  General: Well developed, nourished, in no acute distress, alert and oriented x3   Vascular: DP pulse 2/4 bilateral. PT pulse 2/4 bilateral. Mild edema 3rd toe LT  Dermatology: Erythema, edema, incurvated nail border both 3rd toe  LT with minimal drainage . Tenderness present with palpation. Normal skin tone and texture feet with normal hair growth.  Neurological: Grossly intact. Normal reflexes.   Musculoskeletal: Tenderness with palpation of the distal 3rd toe LT . No tenderness or painful ROM at IPJ.  Diagnosis: Ingrown nail borders third nail left  Plan: -discussed etiology and treatment of ingrown nails. Discussed surgical vs conservative treatment. -Consent signed for appropriate matrixectomy affected nail(s).   Procedure(s):   - Matrixectomy(s) borders third nail left: Toe anesthetized with 3cc 2:1 mixture 2% Lidocaine with epinephrine: Sodium Bicarbonate. Surgical site prepped. Digital tourniquet applied.  Avulsion of borders. performed. Matrixecomy performed with three 30 second applications of phenol to nail matrix. Site irrigated with alcohol.  Tourniquet released with good vascularity noticed in digit.  Applied triple antibiotic to nailbed and applied gauze and Coban dressing. - Written and oral postoperative instructions given.  -Return for post-op 2 weeks.  JINNY Prentice Binder, DPM

## 2024-03-04 NOTE — Patient Instructions (Signed)

## 2024-03-10 ENCOUNTER — Ambulatory Visit: Admitting: Podiatry

## 2024-03-14 ENCOUNTER — Ambulatory Visit: Admitting: Podiatry

## 2024-03-14 ENCOUNTER — Encounter: Payer: Self-pay | Admitting: Podiatry

## 2024-03-14 DIAGNOSIS — L600 Ingrowing nail: Secondary | ICD-10-CM

## 2024-03-14 NOTE — Progress Notes (Signed)
 Patient presents follow-up nail surgery left great toe.  No complaints.  Physical exam:  Dermatologic: Nail surgery site for matrixectomy healing well with no signs of infection.  Diagnosis: 1.  Status post matrixectomy toe.  Healing well  Plan: - POV status post nail surgery hallux right if patient has any problems she can call for appointment otherwise we can see her as needed - Return as needed

## 2024-04-29 DIAGNOSIS — Z7984 Long term (current) use of oral hypoglycemic drugs: Secondary | ICD-10-CM | POA: Diagnosis not present

## 2024-04-29 DIAGNOSIS — H52223 Regular astigmatism, bilateral: Secondary | ICD-10-CM | POA: Diagnosis not present

## 2024-04-29 DIAGNOSIS — E119 Type 2 diabetes mellitus without complications: Secondary | ICD-10-CM | POA: Diagnosis not present

## 2024-06-13 DIAGNOSIS — E7800 Pure hypercholesterolemia, unspecified: Secondary | ICD-10-CM | POA: Diagnosis not present

## 2024-06-13 DIAGNOSIS — E1129 Type 2 diabetes mellitus with other diabetic kidney complication: Secondary | ICD-10-CM | POA: Diagnosis not present

## 2024-06-24 DIAGNOSIS — I10 Essential (primary) hypertension: Secondary | ICD-10-CM | POA: Diagnosis not present

## 2024-06-24 DIAGNOSIS — K219 Gastro-esophageal reflux disease without esophagitis: Secondary | ICD-10-CM | POA: Diagnosis not present

## 2024-06-24 DIAGNOSIS — Z0000 Encounter for general adult medical examination without abnormal findings: Secondary | ICD-10-CM | POA: Diagnosis not present

## 2024-06-24 DIAGNOSIS — E1165 Type 2 diabetes mellitus with hyperglycemia: Secondary | ICD-10-CM | POA: Diagnosis not present

## 2024-06-24 DIAGNOSIS — Z23 Encounter for immunization: Secondary | ICD-10-CM | POA: Diagnosis not present

## 2024-06-24 DIAGNOSIS — E7800 Pure hypercholesterolemia, unspecified: Secondary | ICD-10-CM | POA: Diagnosis not present

## 2024-07-23 DIAGNOSIS — N529 Male erectile dysfunction, unspecified: Secondary | ICD-10-CM | POA: Diagnosis not present
